# Patient Record
Sex: Female | Born: 1945
Health system: Southern US, Community
[De-identification: ages and names within clinical notes are randomized; demographics above are authoritative.]

## PROBLEM LIST (undated history)

## (undated) DIAGNOSIS — IMO0002 Reserved for concepts with insufficient information to code with codable children: Secondary | ICD-10-CM

## (undated) DIAGNOSIS — E785 Hyperlipidemia, unspecified: Secondary | ICD-10-CM

## (undated) HISTORY — PX: CERVICAL CONE BIOPSY: SUR198

## (undated) HISTORY — PX: JOINT REPLACEMENT: SHX530

## (undated) HISTORY — DX: Reserved for concepts with insufficient information to code with codable children: IMO0002

## (undated) HISTORY — DX: Hyperlipidemia, unspecified: E78.5

---

## 1993-11-24 DIAGNOSIS — IMO0002 Reserved for concepts with insufficient information to code with codable children: Secondary | ICD-10-CM

## 1993-11-24 HISTORY — DX: Reserved for concepts with insufficient information to code with codable children: IMO0002

## 2004-08-14 ENCOUNTER — Other Ambulatory Visit: Admission: RE | Admit: 2004-08-14 | Discharge: 2004-08-14 | Payer: Self-pay | Admitting: Obstetrics and Gynecology

## 2008-09-21 ENCOUNTER — Encounter: Admission: RE | Admit: 2008-09-21 | Discharge: 2008-09-21 | Payer: Self-pay | Admitting: Family Medicine

## 2008-09-28 ENCOUNTER — Encounter: Admission: RE | Admit: 2008-09-28 | Discharge: 2008-09-28 | Payer: Self-pay | Admitting: Family Medicine

## 2008-12-05 ENCOUNTER — Encounter: Admission: RE | Admit: 2008-12-05 | Discharge: 2008-12-05 | Payer: Self-pay | Admitting: Orthopaedic Surgery

## 2009-01-05 ENCOUNTER — Encounter: Admission: RE | Admit: 2009-01-05 | Discharge: 2009-04-05 | Payer: Self-pay | Admitting: Orthopaedic Surgery

## 2009-10-01 ENCOUNTER — Encounter: Admission: RE | Admit: 2009-10-01 | Discharge: 2009-10-01 | Payer: Self-pay | Admitting: Family Medicine

## 2010-10-28 ENCOUNTER — Encounter: Admission: RE | Admit: 2010-10-28 | Discharge: 2010-10-28 | Payer: Self-pay | Admitting: Family Medicine

## 2010-11-04 ENCOUNTER — Encounter
Admission: RE | Admit: 2010-11-04 | Discharge: 2010-11-04 | Payer: Self-pay | Source: Home / Self Care | Attending: Family Medicine | Admitting: Family Medicine

## 2011-10-25 LAB — HM MAMMOGRAPHY

## 2012-08-27 ENCOUNTER — Ambulatory Visit: Payer: Self-pay | Admitting: Family Medicine

## 2012-09-29 ENCOUNTER — Encounter: Payer: Self-pay | Admitting: Family Medicine

## 2012-09-29 ENCOUNTER — Ambulatory Visit (INDEPENDENT_AMBULATORY_CARE_PROVIDER_SITE_OTHER): Payer: 59 | Admitting: Family Medicine

## 2012-09-29 ENCOUNTER — Other Ambulatory Visit: Payer: Self-pay | Admitting: Family Medicine

## 2012-09-29 ENCOUNTER — Other Ambulatory Visit: Payer: Self-pay | Admitting: *Deleted

## 2012-09-29 VITALS — BP 120/80 | HR 82 | Temp 98.3°F | Ht 61.0 in | Wt 159.0 lb

## 2012-09-29 DIAGNOSIS — Z1231 Encounter for screening mammogram for malignant neoplasm of breast: Secondary | ICD-10-CM

## 2012-09-29 DIAGNOSIS — Z7689 Persons encountering health services in other specified circumstances: Secondary | ICD-10-CM

## 2012-09-29 DIAGNOSIS — E785 Hyperlipidemia, unspecified: Secondary | ICD-10-CM

## 2012-09-29 DIAGNOSIS — Z131 Encounter for screening for diabetes mellitus: Secondary | ICD-10-CM

## 2012-09-29 DIAGNOSIS — Z7189 Other specified counseling: Secondary | ICD-10-CM

## 2012-09-29 LAB — LIPID PANEL
Cholesterol: 257 mg/dL — ABNORMAL HIGH (ref 0–200)
Total CHOL/HDL Ratio: 4
Triglycerides: 58 mg/dL (ref 0.0–149.0)

## 2012-09-29 NOTE — Progress Notes (Signed)
Chief Complaint  Patient presents with  . Establish Care    HPI: Emelee Rodocker is here to establish care. Used to see Dr. Larina Bras but office closed. Father died of heart disease and smoked and sister died of lung cancer from smoking. Owns insurance company.   Has the following concerns today:  HLD: -chronically -good cholesterol is high and bad cholesterol is high -has been taking red yeast rice which helps some  -does not exercise on a regular basis  -diet is healthy and watches portion sizes   Other Providers:  Flu vaccine: refused, UTD on varicella and tetanus  ROS: See pertinent positives and negatives per HPI.  Past Medical History  Diagnosis Date  . Hyperlipidemia   . Squamous cell carcinoma     Family History  Problem Relation Age of Onset  . Kidney cancer Sister     RENAL CARCINOMA    History   Social History  . Marital Status: Married    Spouse Name: N/A    Number of Children: N/A  . Years of Education: N/A   Social History Main Topics  . Smoking status: Never Smoker   . Smokeless tobacco: None  . Alcohol Use: No  . Drug Use: None  . Sexually Active: None   Other Topics Concern  . None   Social History Narrative  . None    Current outpatient prescriptions:Fish Oil-Cholecalciferol (FISH OIL + D3) 1200-1000 MG-UNIT CAPS, Take by mouth., Disp: , Rfl: ;  loratadine (CLARITIN) 10 MG tablet, Take 10 mg by mouth daily., Disp: , Rfl: ;  Lutein 20 MG CAPS, Take by mouth., Disp: , Rfl: ;  Multiple Vitamins-Minerals (WOMENS ONE DAILY PO), Take by mouth., Disp: , Rfl:   EXAM:  Filed Vitals:   09/29/12 0827  BP: 120/80  Pulse: 82  Temp: 98.3 F (36.8 C)    Body mass index is 30.04 kg/(m^2).  GENERAL: vitals reviewed and listed above, alert, oriented, appears well hydrated and in no acute distress  HEENT: atraumatic, conjunttiva clear, no obvious abnormalities on inspection of external nose and ears  NECK: no obvious masses on inspection  MS:  moves all extremities without noticeable abnormality  PSYCH: pleasant and cooperative, no obvious depression or anxiety  ASSESSMENT AND PLAN:  Discussed the following assessment and plan:  1. Encounter to establish care with new doctor    2. Hyperlipidemia  Lipid Panel  3. Screening for diabetes mellitus  Hemoglobin A1c   -patient will follow up for CPE/Health Maintenance exam  -We reviewed the PMH, PSH, FH, SH, Meds and Allergies. -We provided refills for any medications we will prescribe as needed. -We addressed current concerns per orders and patient instructions. -We have asked for records for pertinent exams, studies, vaccines and notes from previous providers. -We have advised patient to follow up per instructions below. -Influenza vaccine refused  -Patient advised to return or notify a doctor immediately if symptoms worsen or persist or new concerns arise.  Patient Instructions  -We have ordered labs or studies at this visit. It can take up to 1-2 weeks for results and processing. We will contact you with instructions IF your results are abnormal. Normal results will be released to your [facility]. If you have not heard from [place] or can not find your results in [facility] in 2 weeks please contact our office.  -PLEASE SIGN UP FOR MYCHART TODAY   We recommend the following healthy lifestyle measures: - eat a healthy diet consisting of lots of vegetables,  fruits, beans, nuts, seeds, healthy meats such as white chicken and fish and whole grains.  - avoid fried foods, fast food, processed foods, sodas, red meet and other fattening foods.  - get a least 150 minutes of aerobic exercise per week.   Follow up in: 1 month for your complete physical exam      KIM, HANNAH R.

## 2012-09-29 NOTE — Patient Instructions (Addendum)
-  We have ordered labs or studies at this visit. It can take up to 1-2 weeks for results and processing. We will contact you with instructions IF your results are abnormal. Normal results will be released to your [facility]. If you have not heard from [place] or can not find your results in [facility] in 2 weeks please contact our office.  -PLEASE SIGN UP FOR MYCHART TODAY   We recommend the following healthy lifestyle measures: - eat a healthy diet consisting of lots of vegetables, fruits, beans, nuts, seeds, healthy meats such as white chicken and fish and whole grains.  - avoid fried foods, fast food, processed foods, sodas, red meet and other fattening foods.  - get a least 150 minutes of aerobic exercise per week.   Follow up in: 1 month for your complete physical exam

## 2012-09-30 ENCOUNTER — Telehealth: Payer: Self-pay | Admitting: Family Medicine

## 2012-09-30 NOTE — Telephone Encounter (Signed)
Please let patient know, since not yet signed up for mychart, wanted to contact regarding lab results. Recommend signing up for mychart to see these results in about 10 days.  -cholesterol is a quite high -diabetes screening lab is a little high (>5.6) indicating a risk for developing diabetes  The best treatment to hopefully reverse these findings and prevent adverse health outcomes is a healthy diet and regular exercise.  Please make sure Nichole Chandler schedules her CPE in 1-2 months and we can talk about this at her appointment.

## 2012-10-04 NOTE — Telephone Encounter (Signed)
Called and spoke with pt and pt is aware and states she will call back to schedule cpe.

## 2012-11-08 ENCOUNTER — Ambulatory Visit
Admission: RE | Admit: 2012-11-08 | Discharge: 2012-11-08 | Disposition: A | Discharge status: Home / Self Care | Payer: 59 | Source: Ambulatory Visit | Attending: *Deleted | Admitting: *Deleted

## 2012-11-08 DIAGNOSIS — Z1231 Encounter for screening mammogram for malignant neoplasm of breast: Secondary | ICD-10-CM

## 2015-04-24 DIAGNOSIS — H40053 Ocular hypertension, bilateral: Secondary | ICD-10-CM | POA: Diagnosis not present

## 2016-04-22 DIAGNOSIS — L723 Sebaceous cyst: Secondary | ICD-10-CM | POA: Diagnosis not present

## 2016-04-22 DIAGNOSIS — H1013 Acute atopic conjunctivitis, bilateral: Secondary | ICD-10-CM | POA: Diagnosis not present

## 2017-03-03 ENCOUNTER — Telehealth: Payer: Self-pay | Admitting: Family Medicine

## 2017-03-03 NOTE — Telephone Encounter (Signed)
Let her know I am so very sorry to hear about her husband and of course I will see her again.Please schedule 30 minute appt. Thank you.

## 2017-03-03 NOTE — Telephone Encounter (Signed)
Pt would like to see if you would accept her back reason that she has not been in was due to her husband going through so much health issues and passing away.  And pt state that she really enjoyed the 1st visit with Dr. Maudie Mercury and hate that she was not able to continue without neglecting her husband. So would really like for Dr. Maudie Mercury to assist her with her medical needs.

## 2017-03-04 NOTE — Telephone Encounter (Signed)
Pt is aware of the msg from Dr. Maudie Mercury and will call back later today when ready to schedule.

## 2017-03-04 NOTE — Telephone Encounter (Signed)
Pt scheduled  

## 2017-03-24 ENCOUNTER — Encounter: Payer: Self-pay | Admitting: Family Medicine

## 2017-03-24 ENCOUNTER — Ambulatory Visit (INDEPENDENT_AMBULATORY_CARE_PROVIDER_SITE_OTHER): Payer: Medicare Other | Admitting: Family Medicine

## 2017-03-24 VITALS — BP 120/62 | HR 98 | Temp 98.4°F | Ht 61.0 in | Wt 140.7 lb

## 2017-03-24 DIAGNOSIS — Z23 Encounter for immunization: Secondary | ICD-10-CM | POA: Diagnosis not present

## 2017-03-24 DIAGNOSIS — E785 Hyperlipidemia, unspecified: Secondary | ICD-10-CM | POA: Diagnosis not present

## 2017-03-24 DIAGNOSIS — Z7689 Persons encountering health services in other specified circumstances: Secondary | ICD-10-CM

## 2017-03-24 DIAGNOSIS — R739 Hyperglycemia, unspecified: Secondary | ICD-10-CM

## 2017-03-24 DIAGNOSIS — Z789 Other specified health status: Secondary | ICD-10-CM

## 2017-03-24 DIAGNOSIS — B351 Tinea unguium: Secondary | ICD-10-CM

## 2017-03-24 DIAGNOSIS — Z7289 Other problems related to lifestyle: Secondary | ICD-10-CM

## 2017-03-24 NOTE — Progress Notes (Signed)
Pre visit review using our clinic review tool, if applicable. No additional management support is needed unless otherwise documented below in the visit note. 

## 2017-03-24 NOTE — Patient Instructions (Signed)
BEFORE YOU LEAVE: -order cologard -follow up: 1) fasting lab appointment 2) annual wellness visit with susan in next few months 3) follow up with Dr. Maudie Mercury in 3-4 months (skin ck/breast exam if she wishes then)  We ordered the Cologuard test for colon cancer screening. Please complete this test promptly once the kit arrives. Please contact us if you have not received your kit in the next few weeks.  Please call to schedule your mammogram.  We have ordered labs or studies at this visit. It can take up to 1-2 weeks for results and processing. IF results require follow up or explanation, we will call you with instructions. Clinically stable results will be released to your [facility]. If you have not heard from [place] or cannot find your results in [facility] in 2 weeks please contact our office at [phone].  If you are not yet signed up for [facility], please consider signing up.   We recommend the following healthy lifestyle for LIFE: 1) Small portions.   Tip: eat off of a salad plate instead of a dinner plate.  Tip: if you need more or a snack choose fruits, veggies and/or a handful of nuts or seeds.  2) Eat a healthy clean diet.  * Tip: Avoid (less then 1 serving per week): processed foods, sweets, sweetened drinks, white starches (rice, flour, bread, potatoes, pasta, etc), red meat, fast foods, butter  *Tip: CHOOSE instead   * 5-9 servings per day of fresh or frozen fruits and vegetables (but not corn, potatoes, bananas, canned or dried fruit)   *nuts and seeds, beans   *olives and olive oil   *small portions of lean meats such as fish and white chicken    *small portions of whole grains  3)Get at least 150 minutes of sweaty aerobic exercise per week.  4)Reduce stress - consider counseling, meditation and relaxation to balance other aspects of your life.  WE NOW OFFER   [facility] FAST TRACK!!!  SAME DAY Appointments for ACUTE CARE  Such as: Sprains, Injuries, cuts,  abrasions, rashes, muscle pain, joint pain, back pain Colds, flu, sore throats, headache, allergies, cough, fever  Ear pain, sinus and eye infections Abdominal pain, nausea, vomiting, diarrhea, upset stomach Animal/insect bites  3 Easy Ways to Schedule: Walk-In Scheduling Call in scheduling Mychart Sign-up: [email or web address]

## 2017-03-24 NOTE — Progress Notes (Signed)
HPI:  Kinslie Hove is here to reestablish care. Seen once in 2013. Reports had a lot going on. 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 She now is trying to focus on herself and take care of herself. Hx of mild hyperlipidemia and hyperglycemia. Needs labs. Feels good. 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 No smoking. 1-3 beers per day. Only complaint is toenail fungus. She prefers to avoid medications for this if possible. Due for a number of preventive care measures. Agrees to set up mammogram. Opted for cologard after discussion of options. Declined vaccines. Reports all paps in last 30 years normal.  ROS negative for unless reported above: fevers, unintentional weight loss, hearing or vision loss, chest pain, palpitations, struggling to breath, hemoptysis, melena, hematochezia, hematuria, falls, loc, si, thoughts of self harm  Past Medical History:  Diagnosis Date  . Hyperlipidemia   . Squamous cell carcinoma     No past surgical history on file.  Family History  Problem Relation Age of Onset  . Kidney cancer Sister     RENAL CARCINOMA  . Lung cancer Sister     smoker  . Heart disease Father     Social History   Social History  . Marital status: Widowed    Spouse name: N/A  . Number of children: N/A  . Years of education: N/A   Social History Main Topics  . Smoking status: Never Smoker  . Smokeless tobacco: Never Used  . Alcohol use No  . Drug use: Unknown  . Sexual activity: Not Asked   Other Topics Concern  . None   Social History Narrative  . None     Current Outpatient Prescriptions:  .  Fish Oil-Cholecalciferol (FISH OIL + D3) 1200-1000 MG-UNIT CAPS, Take by mouth., Disp: , Rfl:  .  Lutein 20 MG CAPS, Take by mouth., Disp: , Rfl:  .  Multiple Vitamins-Minerals (WOMENS ONE DAILY PO), Take by mouth., Disp: , Rfl:  .  OVER THE COUNTER MEDICATION, Keratin (something for hair,etc.), Disp: , Rfl:    EXAM:  Vitals:   03/24/17 1515  BP: 120/62  Pulse: 98  Temp: 98.4 F (36.9 C)    Body mass index is 26.59 kg/m.  GENERAL: vitals reviewed and listed above, alert, oriented, appears well hydrated and in no acute distress  HEENT: atraumatic, conjunttiva clear, no obvious abnormalities on inspection of external nose and ears  NECK: no obvious masses on inspection  LUNGS: clear to auscultation bilaterally, no wheezes, rales or rhonchi, good air movement  CV: HRRR, no peripheral edema  SKIN: thickened friable great toenail L  MS: moves all extremities without noticeable abnormality  PSYCH: pleasant and cooperative, no obvious depression or anxiety  ASSESSMENT AND PLAN:  Discussed the following assessment and plan:  Encounter to establish care  Hyperlipidemia, unspecified hyperlipidemia type - Plan: Lipid panel  Hyperglycemia - Plan: Hemoglobin A1c  hep c screening - Plan: Hepatitis C antibody  Alcohol use - Plan: Comprehensive metabolic panel  Nail fungus  Need for tetanus booster - Plan: Td vaccine greater than or equal to 7yo preservative free IM  -We reviewed the PMH, PSH, FH, SH, Meds and Allergies. -We provided refills for any medications we will prescribe as needed. -We addressed current concerns per orders and patient instructions. -We have advised patient to follow up per instructions below. -she wants natural remedy for nail fungus and opted to try apple cider vinegar -Patient advised to return or notify  a doctor immediately if symptoms worsen or persist or new concerns arise.  Patient Instructions  BEFORE YOU LEAVE: -order cologard -follow up: 1) fasting lab appointment 2) annual wellness visit with susan in next few months 3) follow up with Dr. Maudie Mercury in 3-4 months (skin ck/breast exam if she wishes then)  We ordered the Cologuard test for colon cancer screening. Please complete this test promptly once the kit arrives. Please contact us if you have  not received your kit in the next few weeks.  Please call to schedule your mammogram.  We have ordered labs or studies at this visit. It can take up to 1-2 weeks for results and processing. IF results require follow up or explanation, we will call you with instructions. Clinically stable results will be released to your [facility]. If you have not heard from [place] or cannot find your results in [facility] in 2 weeks please contact our office at [phone].  If you are not yet signed up for [facility], please consider signing up.   We recommend the following healthy lifestyle for LIFE: 1) Small portions.   Tip: eat off of a salad plate instead of a dinner plate.  Tip: if you need more or a snack choose fruits, veggies and/or a handful of nuts or seeds.  2) Eat a healthy clean diet.  * Tip: Avoid (less then 1 serving per week): processed foods, sweets, sweetened drinks, white starches (rice, flour, bread, potatoes, pasta, etc), red meat, fast foods, butter  *Tip: CHOOSE instead   * 5-9 servings per day of fresh or frozen fruits and vegetables (but not corn, potatoes, bananas, canned or dried fruit)   *nuts and seeds, beans   *olives and olive oil   *small portions of lean meats such as fish and white chicken    *small portions of whole grains  3)Get at least 150 minutes of sweaty aerobic exercise per week.  4)Reduce stress - consider counseling, meditation and relaxation to balance other aspects of your life.  WE NOW OFFER   [facility] FAST TRACK!!!  SAME DAY Appointments for ACUTE CARE  Such as: Sprains, Injuries, cuts, abrasions, rashes, muscle pain, joint pain, back pain Colds, flu, sore throats, headache, allergies, cough, fever  Ear pain, sinus and eye infections Abdominal pain, nausea, vomiting, diarrhea, upset stomach Animal/insect bites  3 Easy Ways to Schedule: Walk-In Scheduling Call in scheduling Mychart Sign-up:  [email or web address]               Colin Benton R.

## 2017-03-31 ENCOUNTER — Other Ambulatory Visit (INDEPENDENT_AMBULATORY_CARE_PROVIDER_SITE_OTHER): Payer: Medicare Other

## 2017-03-31 DIAGNOSIS — Z7289 Other problems related to lifestyle: Secondary | ICD-10-CM | POA: Diagnosis not present

## 2017-03-31 DIAGNOSIS — Z789 Other specified health status: Secondary | ICD-10-CM | POA: Diagnosis not present

## 2017-03-31 DIAGNOSIS — R739 Hyperglycemia, unspecified: Secondary | ICD-10-CM | POA: Diagnosis not present

## 2017-03-31 DIAGNOSIS — E785 Hyperlipidemia, unspecified: Secondary | ICD-10-CM | POA: Diagnosis not present

## 2017-03-31 LAB — COMPREHENSIVE METABOLIC PANEL
ALK PHOS: 46 U/L (ref 39–117)
ALT: 17 U/L (ref 0–35)
AST: 23 U/L (ref 0–37)
Albumin: 4.4 g/dL (ref 3.5–5.2)
BILIRUBIN TOTAL: 0.6 mg/dL (ref 0.2–1.2)
BUN: 20 mg/dL (ref 6–23)
CALCIUM: 9.8 mg/dL (ref 8.4–10.5)
CO2: 28 mEq/L (ref 19–32)
Chloride: 105 mEq/L (ref 96–112)
Creatinine, Ser: 0.71 mg/dL (ref 0.40–1.20)
GFR: 86.81 mL/min (ref >60.00)
Glucose, Bld: 100 mg/dL — ABNORMAL HIGH (ref 70–99)
Potassium: 4.4 mEq/L (ref 3.5–5.1)
Sodium: 141 mEq/L (ref 135–145)
TOTAL PROTEIN: 7.1 g/dL (ref 6.0–8.3)

## 2017-03-31 LAB — LIPID PANEL
CHOLESTEROL: 262 mg/dL — AB (ref 0–200)
HDL: 77.2 mg/dL (ref >39.00)
LDL Cholesterol: 171 mg/dL — ABNORMAL HIGH (ref 0–99)
NonHDL: 184.49
TRIGLYCERIDES: 68 mg/dL (ref 0.0–149.0)
Total CHOL/HDL Ratio: 3
VLDL: 13.6 mg/dL (ref 0.0–40.0)

## 2017-03-31 LAB — HEMOGLOBIN A1C: HEMOGLOBIN A1C: 6.1 % (ref 4.6–6.5)

## 2017-04-01 ENCOUNTER — Other Ambulatory Visit: Payer: Self-pay | Admitting: Family Medicine

## 2017-04-01 DIAGNOSIS — E785 Hyperlipidemia, unspecified: Secondary | ICD-10-CM

## 2017-04-01 LAB — HEPATITIS C ANTIBODY: HCV AB: NEGATIVE

## 2017-04-21 DIAGNOSIS — H40013 Open angle with borderline findings, low risk, bilateral: Secondary | ICD-10-CM | POA: Diagnosis not present

## 2017-04-29 ENCOUNTER — Telehealth: Payer: Self-pay

## 2017-04-29 ENCOUNTER — Ambulatory Visit (INDEPENDENT_AMBULATORY_CARE_PROVIDER_SITE_OTHER): Payer: Medicare Other

## 2017-04-29 VITALS — BP 124/60 | HR 84 | Ht 61.0 in | Wt 140.5 lb

## 2017-04-29 DIAGNOSIS — Z0000 Encounter for general adult medical examination without abnormal findings: Secondary | ICD-10-CM

## 2017-04-29 NOTE — Patient Instructions (Addendum)
Nichole Chandler , Thank you for taking time to come for your Medicare Wellness Visit. I appreciate your ongoing commitment to your health goals. Please review the following plan we discussed and let me know if I can assist you in the future.  It is helpful to do memory games; puzzles; Lumosity or brain teasers of your choice by book; online or other.     Nichole Chandler will try to find your labs if sent from dr. Joaquim Lai  In the meantime, you can go to the [email or web address] and search for pre-diabetes Educated regarding prediabetes and numbers;  A1c ranges from 5.8 to 6.5 or fasting Blood sugar > 115 -126; (126 is diabetic)   Risk: >69yo; family hx; overweight or obese; African American; Hispanic; Latino; American [place] American; [place]; history of diabetes when pregnant; or birth to a baby weighing over 9 lbs. Being less physically active than 30 minutes; 3 times a week;   Prevention; Losing a modest 7 to 8 lbs; If over 200 lbs; 10 to 14 lbs;  Choose healthier foods; colorful veggies; fish or lean meats; drinks water Reduce portion size Start exercising; 30 minutes of fast walking x 30 minutes per day/ 60 min for weight loss   Nichole Chandler to order the cologuard, you should receive it within 10 days / to call cell number [phone] Therefore, Medicare Part B will cover the CologuardTM test once every three years for beneficiaries who meet all of the following criteria:  Age 69 to 87 years,  Asymptomatic (no signs or symptoms of colorectal disease including but not limited to lower gastrointestinal pain, blood in stool, positive guaiac fecal occult blood test or fecal immunochemical test), and  At average risk of developing colorectal cancer (no personal history of adenomatous polyps, colorectal cancer, or inflammatory bowel disease, including Crohn's Disease and ulcerative colitis; no family history of colorectal cancers or adenomatous polyps, familial adenomatous polyposis, or hereditary  nonpolyposis colorectal cancer).  The Centers for Disease Control are now recommending 2 pneumonia vaccinations after 69. The first is the Prevnar 13. This helps to boost your immunity to community acquired pneumonia as well as some protection from bacterial pneumonia  The 2nd is the pneumovax 23, which offers more broad protection!  Please consider taking these as this is your best protection against pneumonia. You can review the CDC.gov for more information    Shingrix is a vaccine for the prevention of Shingles in Adults 69 and older.  If you are on Medicare, you can request a prescription from your doctor to be filled at a pharmacy.  Please check with your benefits regarding applicable copays or out of pocket expenses.  The Shingrix is given in 2 vaccines approx 8 weeks apart. You must receive the 2nd dose prior to 6 months from receipt of the first.   Nichole Chandler will put an order in for the [facility]; mammogram Has had a couple of bone density and they were normal   Will discuss dexa with Dr. Maudie Mercury since you had this at 71 and it was normal     These are the goals we discussed: Goals    . patient          Maintain health; Watch chol  Cholesterol Cholesterol is a fat. Your body needs a small amount of cholesterol. Cholesterol (plaque) may build up in your blood vessels (arteries). That makes you more likely to have a heart attack or stroke. You cannot feel your cholesterol level. Having a blood  test is the only way to find out if your level is high. Keep your test results. Work with your doctor to keep your cholesterol at a good level. What do the results mean?  Total cholesterol is how much cholesterol is in your blood.  LDL is bad cholesterol. This is the type that can build up. Try to have low LDL.  HDL is good cholesterol. It cleans your blood vessels and carries LDL away. Try to have high HDL.  Triglycerides are fat that the body can store or burn for energy. What are  good levels of cholesterol?  Total cholesterol below 200.  LDL below 100 is good for people who have health risks. LDL below 70 is good for people who have very high risks.  HDL above 40 is good. It is best to have HDL of 60 or higher.  Triglycerides below 150. How can I lower my cholesterol? Diet Follow your diet program as told by your doctor.  Choose fish, white meat chicken, or [place] that is roasted or baked. Try not to eat red meat, fried foods, sausage, or lunch meats.  Eat lots of fresh fruits and vegetables.  Choose whole grains, beans, pasta, potatoes, and cereals.  Choose olive oil, corn oil, or canola oil. Only use small amounts.  Try not to eat butter, mayonnaise, shortening, or palm kernel oils.  Try not to eat foods with trans fats.  Choose low-fat or nonfat dairy foods. ? Drink skim or nonfat milk. ? Eat low-fat or nonfat yogurt and cheeses. ? Try not to drink whole milk or cream. ? Try not to eat ice cream, egg yolks, or full-fat cheeses.  Healthy desserts include angel food cake, ginger snaps, animal crackers, hard candy, popsicles, and low-fat or nonfat frozen yogurt. Try not to eat pastries, cakes, pies, and cookies.  Exercise Follow your exercise program as told by your doctor.  Be more active. Try gardening, walking, and taking the stairs.  Ask your doctor about ways that you can be more active.  Medicine  Take over-the-counter and prescription medicines only as told by your doctor.  This information is not intended to replace advice given to you by your health care provider. Make sure you discuss any questions you have with your health care provider. Document Released: 02/06/2009 Document Revised: 06/11/2016 Document Reviewed: 05/22/2016 Elsevier Interactive Patient Education  2017 [employer].        This is a list of the screening recommended for you and due dates:  Health Maintenance  Topic Date Due  . Colon Cancer Screening   06/18/1998  . Pneumonia vaccines (1 of 2 - PCV13) 06/18/2013  . Mammogram  11/08/2014  . Flu Shot  06/24/2017  . Tetanus Vaccine  03/25/2027  . DEXA scan (bone density measurement)  Completed  .  Hepatitis C: One time screening is recommended by Center for Disease Control  (CDC) for  adults born from [birth year] through 1965.   Completed    Prevention of falls: Remove rugs or any tripping hazards in the home Use Non slip mats in bathtubs and showers Placing grab bars next to the toilet and or shower Placing handrails on both sides of the stair way Adding extra lighting in the home.   Personal safety issues reviewed:  1. Consider starting a community watch program per [facility] 2.  Changes batteries is smoke detector and/or carbon monoxide detector  3.  If you have firearms; keep them in a safe place 4.  Wear protection when in the sun; Always wear sunscreen or a hat; It is good to have your doctor check your skin annually or review any new areas of concern 5. Driving safety; Keep in the right lane; stay 3 car lengths behind the car in front of you on the highway; look 3 times prior to pulling out; carry your cell phone everywhere you go!    Learn about the Yellow Dot program:  The program allows first responders at your emergency to have access to who your physician is, as well as your medications and medical conditions.  Citizens requesting the Yellow Dot Packages should contact Master Corporal Nunzio Cobbs at the [employer] [phone] for the first week of the program and beginning the week after Easter citizens should contact their Scientist, physiological.  Meal Ideas & Options:   Breakfast:  ? Whole wheat toast or whole wheat English muffins with peanut butter & hard boiled egg ? Steel cut oats topped with apples & cinnamon and skim milk  ? Fresh fruit: banana, strawberries, melon, berries, peaches  ? Smoothies: strawberries, bananas, greek  yogurt, peanut butter ? Low fat greek yogurt with blueberries and granola  ? Egg white omelet with spinach and mushrooms ? Breakfast couscous: whole wheat couscous, apricots, skim milk, cranberries   Sandwiches:  ? Hummus and grilled vegetables (peppers, zucchini, squash) on whole wheat bread   ? Grilled chicken on whole wheat pita with lettuce, tomatoes, cucumbers or tzatziki  ? Tuna salad on whole wheat bread: tuna salad made with greek yogurt, olives, red peppers, capers, green onions ? Garlic rosemary lamb pita: lamb sauted with garlic, rosemary, salt & pepper; add lettuce, cucumber, greek yogurt to pita - flavor with lemon juice and black pepper   Seafood:  ? Mediterranean grilled salmon, seasoned with garlic, basil, parsley, lemon juice and black pepper ? Shrimp, lemon, and spinach whole-grain pasta salad made with low fat greek yogurt  ? Seared scallops with lemon orzo  ? Seared tuna steaks seasoned salt, pepper, coriander topped with tomato mixture of olives, tomatoes, olive oil, minced garlic, parsley, green onions and cappers   Meats:  ? Herbed greek chicken salad with kalamata olives, cucumber, feta  ? Red bell peppers stuffed with spinach, bulgur, lean ground beef (or lentils) & topped with feta   ? Kebabs: skewers of chicken, tomatoes, onions, zucchini, squash  ? [place] burgers: made with red onions, mint, dill, lemon juice, feta cheese topped with roasted red peppers  Vegetarian ? Cucumber salad: cucumbers, artichoke hearts, celery, red onion, feta cheese, tossed in olive oil & lemon juice  ? Hummus and whole grain pita points with a greek salad (lettuce, tomato, feta, olives, cucumbers, red onion) ? Lentil soup with celery, carrots made with vegetable broth, garlic, salt and pepper  ? Tabouli salad: parsley, bulgur, mint, scallions, cucumbers, tomato, radishes, lemon juice, olive oil, salt and pepper.  Health Maintenance, Female Adopting a healthy lifestyle and getting  preventive care can go a long way to promote health and wellness. Talk with your health care provider about what schedule of regular examinations is right for you. This is a good chance for you to check in with your provider about disease prevention and staying healthy. In between checkups, there are plenty of things you can do on your own. Experts have done a lot of research about which lifestyle changes and preventive measures are most likely to keep you healthy. Ask your health care provider for  more information. Weight and diet Eat a healthy diet  Be sure to include plenty of vegetables, fruits, low-fat dairy products, and lean protein.  Do not eat a lot of foods high in solid fats, added sugars, or salt.  Get regular exercise. This is one of the most important things you can do for your health. ? Most adults should exercise for at least 150 minutes each week. The exercise should increase your heart rate and make you sweat (moderate-intensity exercise). ? Most adults should also do strengthening exercises at least twice a week. This is in addition to the moderate-intensity exercise.  Maintain a healthy weight  Body mass index (BMI) is a measurement that can be used to identify possible weight problems. It estimates body fat based on height and weight. Your health care provider can help determine your BMI and help you achieve or maintain a healthy weight.  For females 76 years of age and older: ? A BMI below 18.5 is considered underweight. ? A BMI of 18.5 to 24.9 is normal. ? A BMI of 25 to 29.9 is considered overweight. ? A BMI of 30 and above is considered obese.  Watch levels of cholesterol and blood lipids  You should start having your blood tested for lipids and cholesterol at 69 years of age, then have this test every 5 years.  You may need to have your cholesterol levels checked more often if: ? Your lipid or cholesterol levels are high. ? You are older than 69 years of  age. ? You are at high risk for heart disease.  Cancer screening Lung Cancer  Lung cancer screening is recommended for adults 65-75 years old who are at high risk for lung cancer because of a history of smoking.  A yearly low-dose CT scan of the lungs is recommended for people who: ? Currently smoke. ? Have quit within the past 15 years. ? Have at least a 30-pack-year history of smoking. A pack year is smoking an average of one pack of cigarettes a day for 1 year.  Yearly screening should continue until it has been 15 years since you quit.  Yearly screening should stop if you develop a health problem that would prevent you from having lung cancer treatment.  Breast Cancer  Practice breast self-awareness. This means understanding how your breasts normally appear and feel.  It also means doing regular breast self-exams. Let your health care provider know about any changes, no matter how small.  If you are in your 20s or 30s, you should have a clinical breast exam (CBE) by a health care provider every 1-3 years as part of a regular health exam.  If you are 62 or older, have a CBE every year. Also consider having a breast X-ray (mammogram) every year.  If you have a family history of breast cancer, talk to your health care provider about genetic screening.  If you are at high risk for breast cancer, talk to your health care provider about having an MRI and a mammogram every year.  Breast cancer gene (BRCA) assessment is recommended for women who have family members with BRCA-related cancers. BRCA-related cancers include: ? Breast. ? Ovarian. ? Tubal. ? Peritoneal cancers.  Results of the assessment will determine the need for genetic counseling and BRCA1 and BRCA2 testing.  Cervical Cancer Your health care provider may recommend that you be screened regularly for cancer of the pelvic organs (ovaries, uterus, and vagina). This screening involves a pelvic examination, including  checking  for microscopic changes to the surface of your cervix (Pap test). You may be encouraged to have this screening done every 3 years, beginning at age 49.  For women ages 70-65, health care providers may recommend pelvic exams and Pap testing every 3 years, or they may recommend the Pap and pelvic exam, combined with testing for human papilloma virus (HPV), every 5 years. Some types of HPV increase your risk of cervical cancer. Testing for HPV may also be done on women of any age with unclear Pap test results.  Other health care providers may not recommend any screening for nonpregnant women who are considered low risk for pelvic cancer and who do not have symptoms. Ask your health care provider if a screening pelvic exam is right for you.  If you have had past treatment for cervical cancer or a condition that could lead to cancer, you need Pap tests and screening for cancer for at least 20 years after your treatment. If Pap tests have been discontinued, your risk factors (such as having a new sexual partner) need to be reassessed to determine if screening should resume. Some women have medical problems that increase the chance of getting cervical cancer. In these cases, your health care provider may recommend more frequent screening and Pap tests.  Colorectal Cancer  This type of cancer can be detected and often prevented.  Routine colorectal cancer screening usually begins at 69 years of age and continues through 69 years of age.  Your health care provider may recommend screening at an earlier age if you have risk factors for colon cancer.  Your health care provider may also recommend using home test kits to check for hidden blood in the stool.  A small camera at the end of a tube can be used to examine your colon directly (sigmoidoscopy or colonoscopy). This is done to check for the earliest forms of colorectal cancer.  Routine screening usually begins at age 65.  Direct examination of  the colon should be repeated every 5-10 years through 69 years of age. However, you may need to be screened more often if early forms of precancerous polyps or small growths are found.  Skin Cancer  Check your skin from head to toe regularly.  Tell your health care provider about any new moles or changes in moles, especially if there is a change in a mole's shape or color.  Also tell your health care provider if you have a mole that is larger than the size of a pencil eraser.  Always use sunscreen. Apply sunscreen liberally and repeatedly throughout the day.  Protect yourself by wearing long sleeves, pants, a wide-brimmed hat, and sunglasses whenever you are outside.  Heart disease, diabetes, and high blood pressure  High blood pressure causes heart disease and increases the risk of stroke. High blood pressure is more likely to develop in: ? People who have blood pressure in the high end of the normal range (130-139/85-89 mm Hg). ? People who are overweight or obese. ? People who are African American.  If you are 18-49 years of age, have your blood pressure checked every 3-5 years. If you are 35 years of age or older, have your blood pressure checked every year. You should have your blood pressure measured twice-once when you are at a hospital or clinic, and once when you are not at a hospital or clinic. Record the average of the two measurements. To check your blood pressure when you are not at a hospital  or clinic, you can use: ? An automated blood pressure machine at a pharmacy. ? A home blood pressure monitor.  If you are between 73 years and 31 years old, ask your health care provider if you should take aspirin to prevent strokes.  Have regular diabetes screenings. This involves taking a blood sample to check your fasting blood sugar level. ? If you are at a normal weight and have a low risk for diabetes, have this test once every three years after 69 years of age. ? If you are  overweight and have a high risk for diabetes, consider being tested at a younger age or more often. Preventing infection Hepatitis B  If you have a higher risk for hepatitis B, you should be screened for this virus. You are considered at high risk for hepatitis B if: ? You were born in a country where hepatitis B is common. Ask your health care provider which countries are considered high risk. ? Your parents were born in a high-risk country, and you have not been immunized against hepatitis B (hepatitis B vaccine). ? You have HIV or AIDS. ? You use needles to inject street drugs. ? You live with someone who has hepatitis B. ? You have had sex with someone who has hepatitis B. ? You get hemodialysis treatment. ? You take certain medicines for conditions, including cancer, organ transplantation, and autoimmune conditions.  Hepatitis C  Blood testing is recommended for: ? Everyone born from [birth year] through 1963. ? Anyone with known risk factors for hepatitis C.  Sexually transmitted infections (STIs)  You should be screened for sexually transmitted infections (STIs) including gonorrhea and chlamydia if: ? You are sexually active and are younger than 69 years of age. ? You are older than 69 years of age and your health care provider tells you that you are at risk for this type of infection. ? Your sexual activity has changed since you were last screened and you are at an increased risk for chlamydia or gonorrhea. Ask your health care provider if you are at risk.  If you do not have HIV, but are at risk, it may be recommended that you take a prescription medicine daily to prevent HIV infection. This is called pre-exposure prophylaxis (PrEP). You are considered at risk if: ? You are sexually active and do not regularly use condoms or know the HIV status of your partner(s). ? You take drugs by injection. ? You are sexually active with a partner who has HIV.  Talk with your health care provider  about whether you are at high risk of being infected with HIV. If you choose to begin PrEP, you should first be tested for HIV. You should then be tested every 3 months for as long as you are taking PrEP. Pregnancy  If you are premenopausal and you may become pregnant, ask your health care provider about preconception counseling.  If you may become pregnant, take 400 to 800 micrograms (mcg) of folic acid every day.  If you want to prevent pregnancy, talk to your health care provider about birth control (contraception). Osteoporosis and menopause  Osteoporosis is a disease in which the bones lose minerals and strength with aging. This can result in serious bone fractures. Your risk for osteoporosis can be identified using a bone density scan.  If you are 68 years of age or older, or if you are at risk for osteoporosis and fractures, ask your health care provider if you should be screened.  Ask your health care provider whether you should take a calcium or vitamin D supplement to lower your risk for osteoporosis.  Menopause may have certain physical symptoms and risks.  Hormone replacement therapy may reduce some of these symptoms and risks. Talk to your health care provider about whether hormone replacement therapy is right for you. Follow these instructions at home:  Schedule regular health, dental, and eye exams.  Stay current with your immunizations.  Do not use any tobacco products including cigarettes, chewing tobacco, or electronic cigarettes.  If you are pregnant, do not drink alcohol.  If you are breastfeeding, limit how much and how often you drink alcohol.  Limit alcohol intake to no more than 1 drink per day for nonpregnant women. One drink equals 12 ounces of beer, 5 ounces of wine, or 1 ounces of hard liquor.  Do not use street drugs.  Do not share needles.  Ask your health care provider for help if you need support or information about quitting drugs.  Tell your  health care provider if you often feel depressed.  Tell your health care provider if you have ever been abused or do not feel safe at home. This information is not intended to replace advice given to you by your health care provider. Make sure you discuss any questions you have with your health care provider. Document Released: 05/26/2011 Document Revised: 04/17/2016 Document Reviewed: 08/14/2015 Elsevier Interactive Patient Education  2018 [place] in the Home Falls can cause injuries and can affect people from all age groups. There are many simple things that you can do to make your home safe and to help prevent falls. What can I do on the outside of my home?  Regularly repair the edges of walkways and driveways and fix any cracks.  Remove high doorway thresholds.  Trim any shrubbery on the main path into your home.  Use bright outdoor lighting.  Clear walkways of debris and clutter, including tools and rocks.  Regularly check that handrails are securely fastened and in good repair. Both sides of any steps should have handrails.  Install guardrails along the edges of any raised decks or porches.  Have leaves, snow, and ice cleared regularly.  Use sand or salt on walkways during winter months.  In the garage, clean up any spills right away, including grease or oil spills. What can I do in the bathroom?  Use night lights.  Install grab bars by the toilet and in the tub and shower. Do not use towel bars as grab bars.  Use non-skid mats or decals on the floor of the tub or shower.  If you need to sit down while you are in the shower, use a plastic, non-slip stool.  Keep the floor dry. Immediately clean up any water that spills on the floor.  Remove soap buildup in the tub or shower on a regular basis.  Attach bath mats securely with double-sided non-slip rug tape.  Remove throw rugs and other tripping hazards from the floor. What can I do in the  bedroom?  Use night lights.  Make sure that a bedside light is easy to reach.  Do not use oversized bedding that drapes onto the floor.  Have a firm chair that has side arms to use for getting dressed.  Remove throw rugs and other tripping hazards from the floor. What can I do in the kitchen?  Clean up any spills right away.  Avoid walking on wet floors.  Place frequently used items in easy-to-reach places.  If you need to reach for something above you, use a sturdy step stool that has a grab bar.  Keep electrical cables out of the way.  Do not use floor polish or wax that makes floors slippery. If you have to use wax, make sure that it is non-skid floor wax.  Remove throw rugs and other tripping hazards from the floor. What can I do in the stairways?  Do not leave any items on the stairs.  Make sure that there are handrails on both sides of the stairs. Fix handrails that are broken or loose. Make sure that handrails are as long as the stairways.  Check any carpeting to make sure that it is firmly attached to the stairs. Fix any carpet that is loose or worn.  Avoid having throw rugs at the top or bottom of stairways, or secure the rugs with carpet tape to prevent them from moving.  Make sure that you have a light switch at the top of the stairs and the bottom of the stairs. If you do not have them, have them installed. What are some other fall prevention tips?  Wear closed-toe shoes that fit well and support your feet. Wear shoes that have rubber soles or low heels.  When you use a stepladder, make sure that it is completely opened and that the sides are firmly locked. Have someone hold the ladder while you are using it. Do not climb a closed stepladder.  Add color or contrast paint or tape to grab bars and handrails in your home. Place contrasting color strips on the first and last steps.  Use mobility aids as needed, such as canes, walkers, scooters, and  crutches.  Turn on lights if it is dark. Replace any light bulbs that burn out.  Set up furniture so that there are clear paths. Keep the furniture in the same spot.  Fix any uneven floor surfaces.  Choose a carpet design that does not hide the edge of steps of a stairway.  Be aware of any and all pets.  Review your medicines with your healthcare provider. Some medicines can cause dizziness or changes in blood pressure, which increase your risk of falling. Talk with your health care provider about other ways that you can decrease your risk of falls. This may include working with a physical therapist or trainer to improve your strength, balance, and endurance. This information is not intended to replace advice given to you by your health care provider. Make sure you discuss any questions you have with your health care provider. Document Released: 10/31/2002 Document Revised: 04/08/2016 Document Reviewed: 12/15/2014 Elsevier Interactive Patient Education  2017 [employer].

## 2017-04-29 NOTE — Progress Notes (Signed)
Subjective:   Nichole Chandler is a 71 y.o. female who presents for an Initial Medicare Annual Wellness Visit.  The Patient was informed that the wellness visit is to identify future health risk and educate and initiate measures that can reduce risk for increased disease through the lifespan.    NO ROS; Medicare Wellness Visit Just retired officially in Dec Was a Psychologist, sport and exercise;  Secondary school teacher; started acquisition plan  Difficult to fup on preventive health due to sale of business and death of family member but will catch up now  Psychosocial Just lost her spouse; leukemia  Did come back to see Dr. Maudie Mercury  Lost spouse and then lost her mother 6 months later  Father died at 34 of MI and he ate a terrible diet    Describes health as good, fair or great?  great  Preventive Screening -Counseling & Management  Colonoscopy; Agrees to cologuard and will order  Pneumonia vaccines - declines for now Did take the tetanus p talking with DR. Doristine Bosworth;  [facility]  dexa in 2011; age 53; perfectly normal  Will discuss with Dr. Maudie Mercury prior to reordering  Smoking history - never smoked  Second Hand Smoke status; No Smokers in the home ETOH no   Medication adherence or issues? No   RISK FACTORS Diet Is monitoring chol;  Stopped eating sausage  Breakfast; toast and avocado; Occasional biscuit Lunch; very light; protein drink Supper; eat out some  Salad   Regular exercise  Yard work, [employer]; gardening  Takes care of her yard   Cardiac Risk Factors:  Advanced aged  >65 in women Hyperlipidemia- started eating sausage everyday Chol 262; Trigl 68; HDL 77; LDL 171  Ratio is 3;   Has started cleaning up diet Given education on lowering chol   Diabetes -  Family History  HD: Kidney disease Obesity  neg  Fall risk no Given education on "Fall Prevention in the Home" for more safety tips the patient can apply as appropriate.     Mobility of Functional  changes this year?no Safety; community, wears sunscreen, safe place for firearms; Motor vehicle accidents; reviewed and no issues   Mental Health:  Any emotional problems? Anxious, depressed, irritable, sad or blue? no Denies feeling depressed or hopeless; voices pleasure in daily life How many social activities have you been engaged in within the last 2 weeks? no .   Activities of Daily Living - See functional screen   Cognitive testing; Ad8 score; 0 or less than 2  MMSE deferred or completed if AD8 + 2 issues     Patient Care Team: Lucretia Kern, DO as PCP - General (Family Medicine) Dr. Anderson Malta stone was her doctor; [facility] was her doctor for many years but she relocated    Immunization History  Administered Date(s) Administered  . Td 03/24/2017   Required Immunizations needed today  Screening test up to date or reviewed for plan of completion Health Maintenance Due  Topic Date Due  . COLONOSCOPY  06/18/1998  . PNA vac Low Risk Adult (1 of 2 - PCV13) 06/18/2013  . MAMMOGRAM  11/08/2014    Agrees to cologuard and will order Declines pneumonia series Agrees to mammogram and repeat dexa if Dr. Maudie Mercury agrees        Objective:    Today's Vitals   04/29/17 0908  BP: 124/60  Pulse: 84  SpO2: 96%  Weight: 140 lb 8 oz (63.7 kg)  Height: 5'  1" (1.549 m)   Body mass index is 26.55 kg/m.   Current Medications (verified) Outpatient Encounter Prescriptions as of 04/29/2017  Medication Sig  . B Complex-C (B-COMPLEX WITH VITAMIN C) tablet Take 1 tablet by mouth daily.  Marland Kitchen BIOGAIA PROBIOTIC (BIOGAIA PROBIOTIC) LIQD Take by mouth daily at 8 pm.  . Biotin 10000 MCG TABS Take by mouth.  . docusate sodium (COLACE) 100 MG capsule Take 100 mg by mouth 2 (two) times daily.  . Fish Oil-Cholecalciferol (FISH OIL + D3) 1200-1000 MG-UNIT CAPS Take by mouth.  . loratadine (CLARITIN) 10 MG tablet Take 10 mg by mouth daily.  . Omega-3 Fatty Acids (FISH OIL) 1200 MG  CAPS Take by mouth.  . Red Yeast Rice 600 MG CAPS Take by mouth 2 (two) times daily.  . Lutein 20 MG CAPS Take by mouth.  . Multiple Vitamins-Minerals (WOMENS ONE DAILY PO) Take by mouth.  Marland Kitchen OVER THE COUNTER MEDICATION Keratin (something for hair,etc.)   No facility-administered encounter medications on file as of 04/29/2017.     Allergies (verified) Patient has no allergy information on record.   History: Past Medical History:  Diagnosis Date  . Hyperlipidemia   . Squamous cell carcinoma    No past surgical history on file. Family History  Problem Relation Age of Onset  . Kidney cancer Sister        RENAL CARCINOMA  . Lung cancer Sister        smoker  . Heart disease Father    Social History   Occupational History  . Not on file.   Social History Main Topics  . Smoking status: Never Smoker  . Smokeless tobacco: Never Used  . Alcohol use No  . Drug use: Unknown  . Sexual activity: Not on file    Tobacco Counseling Counseling given: Yes   Activities of Daily Living In your present state of health, do you have any difficulty performing the following activities: 04/29/2017  Hearing? N  Vision? N  Difficulty concentrating or making decisions? N  Walking or climbing stairs? N  Dressing or bathing? N  Doing errands, shopping? N  Preparing Food and eating ? N  Using the Toilet? N  In the past six months, have you accidently leaked urine? N  Do you have problems with loss of bowel control? N  Managing your Medications? N  Managing your Finances? N  Housekeeping or managing your Housekeeping? N  Some recent data might be hidden    Immunizations and Health Maintenance Immunization History  Administered Date(s) Administered  . Td 03/24/2017   Health Maintenance Due  Topic Date Due  . COLONOSCOPY  06/18/1998  . PNA vac Low Risk Adult (1 of 2 - PCV13) 06/18/2013  . MAMMOGRAM  11/08/2014    Patient Care Team: Lucretia Kern, DO as PCP - General (Family  Medicine)  Indicate any recent Medical Services you may have received from other than [facility] providers in the past year (date may be approximate).     Assessment:   This is a routine wellness examination for [name].   Hearing/Vision screen Hearing Screening Comments: No issue Vision Screening Comments: No issues with vision Regular eye exam  Dietary issues and exercise activities discussed: Current Exercise Habits: Structured exercise class (plans to start)  Goals    . patient          Maintain health; Watch chol  Cholesterol Cholesterol is a fat. Your body needs a small amount of cholesterol. Cholesterol (plaque) may  build up in your blood vessels (arteries). That makes you more likely to have a heart attack or stroke. You cannot feel your cholesterol level. Having a blood test is the only way to find out if your level is high. Keep your test results. Work with your doctor to keep your cholesterol at a good level. What do the results mean?  Total cholesterol is how much cholesterol is in your blood.  LDL is bad cholesterol. This is the type that can build up. Try to have low LDL.  HDL is good cholesterol. It cleans your blood vessels and carries LDL away. Try to have high HDL.  Triglycerides are fat that the body can store or burn for energy. What are good levels of cholesterol?  Total cholesterol below 200.  LDL below 100 is good for people who have health risks. LDL below 70 is good for people who have very high risks.  HDL above 40 is good. It is best to have HDL of 60 or higher.  Triglycerides below 150. How can I lower my cholesterol? Diet Follow your diet program as told by your doctor.  Choose fish, white meat chicken, or [place] that is roasted or baked. Try not to eat red meat, fried foods, sausage, or lunch meats.  Eat lots of fresh fruits and vegetables.  Choose whole grains, beans, pasta, potatoes, and cereals.  Choose olive oil, corn oil, or canola oil.  Only use small amounts.  Try not to eat butter, mayonnaise, shortening, or palm kernel oils.  Try not to eat foods with trans fats.  Choose low-fat or nonfat dairy foods. ? Drink skim or nonfat milk. ? Eat low-fat or nonfat yogurt and cheeses. ? Try not to drink whole milk or cream. ? Try not to eat ice cream, egg yolks, or full-fat cheeses.  Healthy desserts include angel food cake, ginger snaps, animal crackers, hard candy, popsicles, and low-fat or nonfat frozen yogurt. Try not to eat pastries, cakes, pies, and cookies.  Exercise Follow your exercise program as told by your doctor.  Be more active. Try gardening, walking, and taking the stairs.  Ask your doctor about ways that you can be more active.  Medicine  Take over-the-counter and prescription medicines only as told by your doctor.  This information is not intended to replace advice given to you by your health care provider. Make sure you discuss any questions you have with your health care provider. Document Released: 02/06/2009 Document Revised: 06/11/2016 Document Reviewed: 05/22/2016 Elsevier Interactive Patient Education  2017 [place] 2/9 Scores 04/29/2017  PHQ - 2 Score 0    Fall Risk Fall Risk  04/29/2017  Falls in the past year? No    Cognitive Function: MMSE - Mini Mental State Exam 04/29/2017  Not completed: (No Data)      Ad8 score is 0   Screening Tests Health Maintenance  Topic Date Due  . COLONOSCOPY  06/18/1998  . PNA vac Low Risk Adult (1 of 2 - PCV13) 06/18/2013  . MAMMOGRAM  11/08/2014  . INFLUENZA VACCINE  06/24/2017  . TETANUS/TDAP  03/25/2027  . DEXA SCAN  Completed  . Hepatitis C Screening  Completed      Plan:    PCP Notes  Health Maintenance Will order mammogram a the [facility] Will repeat dexa if Dr. Maudie Mercury approves; was normal but prior to age 37 The patient agrees  Declines pneumonia series   Will consider  the shingrix and  educated    Abnormal Screens  A1c elevated; educated regarding pre-diabetes and improving numbers, see Goals  Referrals none  Patient concerns; no, very happy and enjoying her retirement  Nurse Concerns; as noted  Next PCP apt TBS     I have personally reviewed and noted the following in the patient's chart:   . Medical and social history . Use of alcohol, tobacco or illicit drugs  . Current medications and supplements . Functional ability and status . Nutritional status . Physical activity . Advanced directives . List of other physicians . Hospitalizations, surgeries, and ER visits in previous 12 months . Vitals . Screenings to include cognitive, depression, and falls . Referrals and appointments  In addition, I have reviewed and discussed with patient certain preventive protocols, quality metrics, and best practice recommendations. A written personalized care plan for preventive services as well as general preventive health recommendations were provided to patient.     Wynetta Fines, RN   04/29/2017

## 2017-04-29 NOTE — Progress Notes (Signed)
I have reviewed and agree with note, evaluation, plan. Signed today for Dr. Maudie Mercury as she is out of office.   Garret Reddish, MD

## 2017-04-29 NOTE — Telephone Encounter (Signed)
AWV on 06/06  Had dexa  In 2011 when 64 Discussed and agreed to repeat due to guidelines and screening at 67 even though prior dexa was normal  Please advise if you agree and I will order dexa and mammogram at the breast center

## 2017-04-30 NOTE — Telephone Encounter (Signed)
I agree. Thanks.

## 2017-05-01 ENCOUNTER — Telehealth: Payer: Self-pay

## 2017-05-01 ENCOUNTER — Other Ambulatory Visit: Payer: Self-pay

## 2017-05-01 DIAGNOSIS — Z1231 Encounter for screening mammogram for malignant neoplasm of breast: Secondary | ICD-10-CM

## 2017-05-01 DIAGNOSIS — E2839 Other primary ovarian failure: Secondary | ICD-10-CM

## 2017-05-01 NOTE — Telephone Encounter (Signed)
Dr. Maudie Mercury agreed with repeat dexa and mammogram Order to the Breast center   Call to the patient to let her know the dexa and mammogram were ordered at the breast center

## 2017-06-10 ENCOUNTER — Other Ambulatory Visit: Payer: Self-pay | Admitting: Family Medicine

## 2017-06-10 ENCOUNTER — Telehealth: Payer: Self-pay | Admitting: Family Medicine

## 2017-06-10 DIAGNOSIS — Z1231 Encounter for screening mammogram for malignant neoplasm of breast: Secondary | ICD-10-CM

## 2017-06-10 NOTE — Telephone Encounter (Signed)
° ° °  Pt said she has not received the Colon Guard that was suppose to sent to her

## 2017-06-11 NOTE — Telephone Encounter (Signed)
I called the pt and informed her I re-faxed the order to [facility] and to call the office back if she has not heard anything in a week or so.

## 2017-06-23 ENCOUNTER — Ambulatory Visit
Admission: RE | Admit: 2017-06-23 | Discharge: 2017-06-23 | Disposition: A | Discharge status: Home / Self Care | Payer: Medicare Other | Source: Ambulatory Visit | Attending: Family Medicine | Admitting: Family Medicine

## 2017-06-23 DIAGNOSIS — Z1231 Encounter for screening mammogram for malignant neoplasm of breast: Secondary | ICD-10-CM

## 2017-06-23 DIAGNOSIS — M85832 Other specified disorders of bone density and structure, left forearm: Secondary | ICD-10-CM | POA: Diagnosis not present

## 2017-06-23 DIAGNOSIS — E2839 Other primary ovarian failure: Secondary | ICD-10-CM

## 2017-06-23 DIAGNOSIS — Z780 Asymptomatic menopausal state: Secondary | ICD-10-CM | POA: Diagnosis not present

## 2017-07-06 DIAGNOSIS — Z1212 Encounter for screening for malignant neoplasm of rectum: Secondary | ICD-10-CM | POA: Diagnosis not present

## 2017-07-06 DIAGNOSIS — Z1211 Encounter for screening for malignant neoplasm of colon: Secondary | ICD-10-CM | POA: Diagnosis not present

## 2017-07-06 LAB — COLOGUARD: Cologuard: NEGATIVE

## 2017-07-14 ENCOUNTER — Encounter: Payer: Self-pay | Admitting: Family Medicine

## 2017-08-13 ENCOUNTER — Encounter: Payer: Self-pay | Admitting: Family Medicine

## 2017-08-19 NOTE — Progress Notes (Signed)
HPI:  Due for flu shot, pcv13, lipid recheck  Follow up Hyperlipidemia/hyperglycemia/osteopenia: -LDL high, ratio ok -advised lifestyle changes and option of statin -reports she has made dramatic lifestyle changes including a much healthier diet, regular exercise, ensuring she is getting regular vitamin D -Feels great and lost 4 pounds -no chest pain, pain, shortness of breath -refuses vaccines  ROS: See pertinent positives and negatives per HPI.  Past Medical History:  Diagnosis Date  . Hyperlipidemia   . Squamous cell carcinoma     No past surgical history on file.  Family History  Problem Relation Age of Onset  . Kidney cancer Sister        RENAL CARCINOMA  . Lung cancer Sister        smoker  . Heart disease Father     Social History   Social History  . Marital status: Widowed    Spouse name: N/A  . Number of children: N/A  . Years of education: N/A   Social History Main Topics  . Smoking status: Never Smoker  . Smokeless tobacco: Never Used  . Alcohol use No  . Drug use: Unknown  . Sexual activity: Not Asked   Other Topics Concern  . None   Social History Narrative  . None     Current Outpatient Prescriptions:  .  Coenzyme Q10 (COQ10 PO), Take 200 mg by mouth daily., Disp: , Rfl:  .  Fish Oil-Cholecalciferol (FISH OIL + D3) 1200-1000 MG-UNIT CAPS, Take by mouth., Disp: , Rfl:  .  loratadine (CLARITIN) 10 MG tablet, Take 10 mg by mouth daily as needed. , Disp: , Rfl:  .  LUTEIN PO, Take 25 mg by mouth daily., Disp: , Rfl:  .  Multiple Vitamins-Minerals (WOMENS ONE DAILY PO), Take by mouth., Disp: , Rfl:  .  OVER THE COUNTER MEDICATION, Keratin (something for hair,etc.), Disp: , Rfl:  .  OVER THE COUNTER MEDICATION, Nucific probiotic, Disp: , Rfl:  .  Red Yeast Rice 600 MG CAPS, Take by mouth daily. , Disp: , Rfl:   EXAM:  Vitals:   08/20/17 1056  BP: 100/70  Pulse: 78  Temp: 98.7 F (37.1 C)    Body mass index is 25.7 kg/m.  GENERAL:  vitals reviewed and listed above, alert, oriented, appears well hydrated and in no acute distress  HEENT: atraumatic, conjunttiva clear, no obvious abnormalities on inspection of external nose and ears  NECK: no obvious masses on inspection  LUNGS: clear to auscultation bilaterally, no wheezes, rales or rhonchi, good air movement  CV: HRRR, no peripheral edema  MS: moves all extremities without noticeable abnormality  PSYCH: pleasant and cooperative, no obvious depression or anxiety  ASSESSMENT AND PLAN:  Discussed the following assessment and plan:  Hyperlipidemia, unspecified hyperlipidemia type - Plan: Lipid panel  Hyperglycemia - Plan: Hemoglobin A1c  BMI 25.0-25.9,adult  -Labs per orders, she is not fasting today so she will schedule lab visit for this -Discussed healthy diet at length and suggested the Mediterranean diet would be probably the best for her metabolic profile - congratulated her on impressive changes! -She declined all vaccines -Patient advised to return or notify a doctor immediately if symptoms worsen or persist or new concerns arise.  Patient Instructions  BEFORE YOU LEAVE: -follow up:  1) lab visit in next few weeks for fasting labs 2) AWV with [name] and follow up with Dr. Maudie Mercury in June 2019  We have ordered labs or studies at this visit. It can take up  to 1-2 weeks for results and processing. IF results require follow up or explanation, we will call you with instructions. Clinically stable results will be released to your [facility]. If you have not heard from [place] or cannot find your results in [facility] in 2 weeks please contact our office at [phone].  If you are not yet signed up for [facility], please consider signing up.   We recommend the following healthy lifestyle for LIFE: 1) Regular healthy meals.   2) Eat a healthy clean diet.   TRY TO EAT: -at least 5-7 servings of low sugar vegetables per day (not corn, potatoes or bananas.) -berries are  the best choice if you wish to eat fruit.   -lean meets (fish, chicken or [place] breasts) -vegan proteins for some meals - beans or tofu, whole grains, nuts and seeds -Replace bad fats with good fats - good fats include: fish, nuts and seeds, canola oil, olive oil -small amounts of low fat or non fat dairy -small amounts of100 % whole grains - check the lables  AVOID: -SUGAR, sweets, anything with added sugar, corn syrup or sweeteners -if you must have a sweetener, small amounts of stevia may be best -sweetened beverages -simple starches (rice, bread, potatoes, pasta, chips, etc - small amounts of 100% whole grains are ok) -red meat, pork, butter -fried foods, fast food, processed food, excessive dairy, eggs and coconut.  3)Get at least 150 minutes of sweaty aerobic exercise per week.  4)Reduce stress - consider counseling, meditation and relaxation to balance other aspects of your life.    Colin Benton R., DO

## 2017-08-20 ENCOUNTER — Ambulatory Visit (INDEPENDENT_AMBULATORY_CARE_PROVIDER_SITE_OTHER): Payer: Medicare Other | Admitting: Family Medicine

## 2017-08-20 ENCOUNTER — Encounter: Payer: Self-pay | Admitting: Family Medicine

## 2017-08-20 VITALS — BP 100/70 | HR 78 | Temp 98.7°F | Ht 61.0 in | Wt 136.0 lb

## 2017-08-20 DIAGNOSIS — R739 Hyperglycemia, unspecified: Secondary | ICD-10-CM | POA: Diagnosis not present

## 2017-08-20 DIAGNOSIS — E785 Hyperlipidemia, unspecified: Secondary | ICD-10-CM | POA: Diagnosis not present

## 2017-08-20 DIAGNOSIS — Z6825 Body mass index (BMI) 25.0-25.9, adult: Secondary | ICD-10-CM

## 2017-08-20 NOTE — Patient Instructions (Addendum)
BEFORE YOU LEAVE: -follow up:  1) lab visit in next few weeks for fasting labs 2) AWV with [name] and follow up with Dr. Maudie Mercury in June 2019  We have ordered labs or studies at this visit. It can take up to 1-2 weeks for results and processing. IF results require follow up or explanation, we will call you with instructions. Clinically stable results will be released to your [facility]. If you have not heard from [place] or cannot find your results in [facility] in 2 weeks please contact our office at [phone].  If you are not yet signed up for [facility], please consider signing up.   We recommend the following healthy lifestyle for LIFE: 1) Regular healthy meals.   2) Eat a healthy clean diet.   TRY TO EAT: -at least 5-7 servings of low sugar vegetables per day (not corn, potatoes or bananas.) -berries are the best choice if you wish to eat fruit.   -lean meets (fish, chicken or [place] breasts) -vegan proteins for some meals - beans or tofu, whole grains, nuts and seeds -Replace bad fats with good fats - good fats include: fish, nuts and seeds, canola oil, olive oil -small amounts of low fat or non fat dairy -small amounts of100 % whole grains - check the lables  AVOID: -SUGAR, sweets, anything with added sugar, corn syrup or sweeteners -if you must have a sweetener, small amounts of stevia may be best -sweetened beverages -simple starches (rice, bread, potatoes, pasta, chips, etc - small amounts of 100% whole grains are ok) -red meat, pork, butter -fried foods, fast food, processed food, excessive dairy, eggs and coconut.  3)Get at least 150 minutes of sweaty aerobic exercise per week.  4)Reduce stress - consider counseling, meditation and relaxation to balance other aspects of your life.

## 2017-08-26 ENCOUNTER — Telehealth: Payer: Self-pay | Admitting: *Deleted

## 2017-08-26 ENCOUNTER — Telehealth: Payer: Self-pay

## 2017-08-26 NOTE — Telephone Encounter (Signed)
Nichole Chandler left VM to call her back regarding med note. Outreach today 10/03 States she noted on her last PE with Dr. Maudie Mercury; No 4 discussed stress and counseling and thought Dr. Maudie Mercury felt she was stressed.  Explained that was not the case. Her Psych exam was normal and that this was entered under suggestions only for a "health life".   This was not specifically pertaining to her. Was satisfied with the clarification.  Wynetta Fines RN

## 2017-08-26 NOTE — Telephone Encounter (Signed)
Patient requested to speak with Nichole Chandler regarding a clarification she did not understand on her report. Please call the patient [phone]  Patient requested to speak to only Nichole Chandler being she was the one who done her wellness with her.

## 2017-09-04 ENCOUNTER — Other Ambulatory Visit (INDEPENDENT_AMBULATORY_CARE_PROVIDER_SITE_OTHER): Payer: Medicare Other

## 2017-09-04 DIAGNOSIS — R739 Hyperglycemia, unspecified: Secondary | ICD-10-CM

## 2017-09-04 DIAGNOSIS — E785 Hyperlipidemia, unspecified: Secondary | ICD-10-CM | POA: Diagnosis not present

## 2017-09-04 LAB — LIPID PANEL
Cholesterol: 223 mg/dL — ABNORMAL HIGH (ref 0–200)
HDL: 67.5 mg/dL (ref >39.00)
LDL Cholesterol: 138 mg/dL — ABNORMAL HIGH (ref 0–99)
NONHDL: 155.16
Total CHOL/HDL Ratio: 3
Triglycerides: 85 mg/dL (ref 0.0–149.0)
VLDL: 17 mg/dL (ref 0.0–40.0)

## 2017-09-04 LAB — HEMOGLOBIN A1C: HEMOGLOBIN A1C: 6 % (ref 4.6–6.5)

## 2017-12-03 ENCOUNTER — Encounter: Payer: Self-pay | Admitting: Family Medicine

## 2017-12-21 DIAGNOSIS — H6123 Impacted cerumen, bilateral: Secondary | ICD-10-CM | POA: Diagnosis not present

## 2018-01-23 ENCOUNTER — Encounter: Payer: Self-pay | Admitting: Family Medicine

## 2018-01-25 ENCOUNTER — Other Ambulatory Visit: Payer: Self-pay | Admitting: *Deleted

## 2018-01-25 DIAGNOSIS — D229 Melanocytic nevi, unspecified: Secondary | ICD-10-CM

## 2018-03-02 ENCOUNTER — Telehealth: Payer: Self-pay | Admitting: *Deleted

## 2018-03-02 ENCOUNTER — Other Ambulatory Visit: Payer: Self-pay | Admitting: *Deleted

## 2018-03-02 ENCOUNTER — Other Ambulatory Visit: Payer: Medicare Other

## 2018-03-02 DIAGNOSIS — R739 Hyperglycemia, unspecified: Secondary | ICD-10-CM

## 2018-03-02 DIAGNOSIS — E785 Hyperlipidemia, unspecified: Secondary | ICD-10-CM

## 2018-03-02 NOTE — Telephone Encounter (Signed)
Orders entered and message forwarded to [facility].

## 2018-03-02 NOTE — Telephone Encounter (Signed)
Nichole Chandler stated she spoke with the pt and she stated she wants an A1C done as well in addition to the lipid panel - its been 6  months - she has lost more weight and has been watching her sugar intake.  Patient is coming in tomorrow.  Message sent to Dr Maudie Mercury.

## 2018-03-02 NOTE — Telephone Encounter (Signed)
Okay 

## 2018-03-03 ENCOUNTER — Other Ambulatory Visit (INDEPENDENT_AMBULATORY_CARE_PROVIDER_SITE_OTHER): Payer: Medicare Other

## 2018-03-03 DIAGNOSIS — E785 Hyperlipidemia, unspecified: Secondary | ICD-10-CM | POA: Diagnosis not present

## 2018-03-03 DIAGNOSIS — D229 Melanocytic nevi, unspecified: Secondary | ICD-10-CM | POA: Diagnosis not present

## 2018-03-03 DIAGNOSIS — R58 Hemorrhage, not elsewhere classified: Secondary | ICD-10-CM | POA: Diagnosis not present

## 2018-03-03 DIAGNOSIS — L570 Actinic keratosis: Secondary | ICD-10-CM | POA: Diagnosis not present

## 2018-03-03 DIAGNOSIS — D489 Neoplasm of uncertain behavior, unspecified: Secondary | ICD-10-CM | POA: Diagnosis not present

## 2018-03-03 DIAGNOSIS — R739 Hyperglycemia, unspecified: Secondary | ICD-10-CM | POA: Diagnosis not present

## 2018-03-03 LAB — LIPID PANEL
CHOL/HDL RATIO: 3
Cholesterol: 233 mg/dL — ABNORMAL HIGH (ref 0–200)
HDL: 75.7 mg/dL (ref >39.00)
LDL CALC: 146 mg/dL — AB (ref 0–99)
NONHDL: 156.95
Triglycerides: 56 mg/dL (ref 0.0–149.0)
VLDL: 11.2 mg/dL (ref 0.0–40.0)

## 2018-03-03 LAB — HEMOGLOBIN A1C: Hgb A1c MFr Bld: 6 % (ref 4.6–6.5)

## 2018-04-09 DIAGNOSIS — H40013 Open angle with borderline findings, low risk, bilateral: Secondary | ICD-10-CM | POA: Diagnosis not present

## 2018-04-30 ENCOUNTER — Ambulatory Visit: Payer: Medicare Other

## 2018-05-17 NOTE — Progress Notes (Signed)
HPI:  Using dictation device. Unfortunately this device frequently misinterprets words/phrases.  Nichole Chandler is a pleasant 72 y.o. here for follow up. Chronic medical problems summarized below were reviewed for changes and stability and were updated as needed below. These issues and their treatment remain stable for the most part. Reports physically doing great. Reports she has made significant changes to diet and has cut out sugar and junk and is eating a healthy low sugar whole foods diet and getting regular exercise. She is very pleased that she has had some wt reduction with this. Does not want to take medications for her cholesterol. Strong family hx of cholesterol issues. Sister battling ovarian cancer - genetic testing underway and she may consider screening. Denies CP, SOB, DOE, treatment intolerance or new symptoms.   AWV 05/18/18 with [name] (today) Hyperlipidemia: -advised lifestyle changes and consideration statin -she opted to try fish oil and red yeas rice  Hyperglycemia: -advised lifestyle changes  Osteopenia: -advised vit D3 and weight bearing exercise, healthy diet with adequate calcium -last bone density 05/2017  ROS: See pertinent positives and negatives per HPI.  Past Medical History:  Diagnosis Date  . Hyperlipidemia   . Squamous cell carcinoma     No past surgical history on file.  Family History  Problem Relation Age of Onset  . Kidney cancer Sister        RENAL CARCINOMA  . Lung cancer Sister        smoker  . Heart disease Father     SOCIAL HX: see hpi   Current Outpatient Medications:  .  Coenzyme Q10 (COQ10 PO), Take 200 mg by mouth daily., Disp: , Rfl:  .  Fish Oil-Cholecalciferol (FISH OIL + D3) 1200-1000 MG-UNIT CAPS, Take by mouth., Disp: , Rfl:  .  loratadine (CLARITIN) 10 MG tablet, Take 10 mg by mouth daily as needed. , Disp: , Rfl:  .  LUTEIN PO, Take 25 mg by mouth daily., Disp: , Rfl:  .  Multiple Vitamins-Minerals (WOMENS ONE  DAILY PO), Take by mouth., Disp: , Rfl:  .  OVER THE COUNTER MEDICATION, Keratin (something for hair,etc.), Disp: , Rfl:  .  OVER THE COUNTER MEDICATION, Nucific probiotic, Disp: , Rfl:  .  Red Yeast Rice 600 MG CAPS, Take by mouth daily. , Disp: , Rfl:   EXAM:  Vitals:   05/18/18 0853  BP: 118/68  Pulse: 78  SpO2: 98%    Body mass index is 23.63 kg/m.  GENERAL: vitals reviewed and listed above, alert, oriented, appears well hydrated and in no acute distress  HEENT: atraumatic, conjunttiva clear, no obvious abnormalities on inspection of external nose and ears  NECK: no obvious masses on inspection  LYMPH: no LAD  LUNGS: clear to auscultation bilaterally, no wheezes, rales or rhonchi, good air movement  CV: HRRR, no peripheral edema  MS: moves all extremities without noticeable abnormality  PSYCH: pleasant and cooperative, no obvious depression or anxiety  ASSESSMENT AND PLAN:  Discussed the following assessment and plan:  Hyperlipidemia, unspecified hyperlipidemia type  Hyperglycemia  Osteopenia, unspecified location  -congratulated on lifestyle changes and success -Encouraged a lifelong commitment to a healthy diet and regular exercise -Supported regarding devastating diagnosis and sister and discussed that the cancer center may offer ovarian cancer screening, advised regular preventive care measures and she also may consider seeing a gynecologist, she is waiting on the results of the genetic test with her sister -Blood pressure is a little elevated on arrival, but was normal  on recheck -She does not wish to take a statin for cholesterol, we discussed risks with supplements, she prefers to continue these and healthy diet regular exercise -follow-up 4 to 6 months or sooner as needed -Patient advised to return or notify a doctor immediately if symptoms worsen or persist or new concerns arise.  Patient Instructions  BEFORE YOU LEAVE: -follow up: 4-6  month  Continue a healthy diet and regular exercise. Weight is great right now.   If any concerns please follow up sooner.    We recommend the following healthy lifestyle for LIFE: 1) Small portions. But, make sure to get regular (at least 3 per day), healthy meals and small healthy snacks if needed.  2) Eat a healthy clean diet.   TRY TO EAT: -at least 5-7 servings of low sugar, colorful, and nutrient rich vegetables per day (not corn, potatoes or bananas.) -berries are the best choice if you wish to eat fruit (only eat small amounts if trying to reduce weight)  -lean meets (fish, white meat of chicken or [place]) -vegan proteins for some meals - beans or tofu, whole grains, nuts and seeds -Replace bad fats with good fats - good fats include: fish, nuts and seeds, canola oil, olive oil -small amounts of low fat or non fat dairy -small amounts of100 % whole grains - check the lables -drink plenty of water  AVOID: -SUGAR, sweets, anything with added sugar, corn syrup or sweeteners - must read labels as even foods advertised as "healthy" often are loaded with sugar -if you must have a sweetener, small amounts of stevia may be best -sweetened beverages and artificially sweetened beverages -simple starches (rice, bread, potatoes, pasta, chips, etc - small amounts of 100% whole grains are ok) -red meat, pork, butter -fried foods, fast food, processed food, excessive dairy, eggs and coconut.  3)Get at least 150 minutes of sweaty aerobic exercise per week.  4)Reduce stress - consider counseling, meditation and relaxation to balance other aspects of your life.     Lucretia Kern, DO

## 2018-05-17 NOTE — Progress Notes (Signed)
Subjective:   Nichole Chandler is a 72 y.o. female who presents for Medicare Annual (Subsequent) preventive examination.  Reports health as good   Just retired officially in Dec 2017 Was a Psychologist, sport and exercise;  Secondary school teacher; started acquisition plan  Difficult to fup on preventive health due to sale of business and death of family member but will catch up now  Nichole Chandler her sister has ovarian cancer (85)   Just dx  Spread to bowel; liver and lung  6 sister of 91; one sister deceased Very anxious and sad today   Psychosocial Lost spouse to leukemia  lost her mother 6 months later  Father died at 66 of MI and he ate a terrible diet    Diet 23.6  Dropping a pound or 2 q couple of weeks No issues, just busy and watching her diet  Chol 233; hdl 75; trig 56 a1c 6.0 at last check, she is aware  Is monitoring chol;  Stopped eating sausage  Breakfast; toast and avocado; Lunch; very light; protein drink Supper; eat out some  With Nichole Chandler being sick, she is not eating as much and staying with her as much as possible   Regular exercise  Yard work, [employer]; gardening while at [employer] in the summer  Takes care of her yard Now she is absorbed with taking care of her sister    There are no preventive care reminders to display for this patient.  Mammogram 05/2017 Dexa 05/2017  -1.6 Discussed shingrix last year  cologuard 04/2017 - 06/2020  Eye pressure is 20  Will recheck for visual field  Cardiac Risk Factors include: advanced age (>42men, >41 women);family history of premature cardiovascular disease     Objective:     Vitals: BP 140/80   Pulse 78   Ht 5' (1.524 m)   Wt 121 lb (54.9 kg)   SpO2 98%   BMI 23.63 kg/m   Body mass index is 23.63 kg/m.  Advanced Directives 05/18/2018 04/29/2017  Does Patient Have a Medical Advance Directive? Yes No    Tobacco Social History   Tobacco Use  Smoking Status Never Smoker  Smokeless Tobacco Never Used       Counseling given: Yes   Clinical Intake:    Past Medical History:  Diagnosis Date  . Hyperlipidemia   . Squamous cell carcinoma    No past surgical history on file. Family History  Problem Relation Age of Onset  . Kidney cancer Sister        RENAL CARCINOMA  . Lung cancer Sister        smoker  . Heart disease Father    Social History   Socioeconomic History  . Marital status: Widowed    Spouse name: Not on file  . Number of children: Not on file  . Years of education: Not on file  . Highest education level: Not on file  Occupational History  . Not on file  Social Needs  . Financial resource strain: Not on file  . Food insecurity:    Worry: Not on file    Inability: Not on file  . Transportation needs:    Medical: Not on file    Non-medical: Not on file  Tobacco Use  . Smoking status: Never Smoker  . Smokeless tobacco: Never Used  Substance and Sexual Activity  . Alcohol use: No  . Drug use: Not on file  . Sexual activity: Not on file  Lifestyle  . Physical activity:  Days per week: Not on file    Minutes per session: Not on file  . Stress: Not on file  Relationships  . Social connections:    Talks on phone: Not on file    Gets together: Not on file    Attends religious service: Not on file    Active member of club or organization: Not on file    Attends meetings of clubs or organizations: Not on file    Relationship status: Not on file  Other Topics Concern  . Not on file  Social History Narrative  . Not on file    Outpatient Encounter Medications as of 05/18/2018  Medication Sig  . Coenzyme Q10 (COQ10 PO) Take 200 mg by mouth daily.  . Fish Oil-Cholecalciferol (FISH OIL + D3) 1200-1000 MG-UNIT CAPS Take by mouth.  . loratadine (CLARITIN) 10 MG tablet Take 10 mg by mouth daily as needed.   . LUTEIN PO Take 25 mg by mouth daily.  . Multiple Vitamins-Minerals (WOMENS ONE DAILY PO) Take by mouth.  Marland Kitchen OVER THE COUNTER MEDICATION Nucific probiotic   . Red Yeast Rice 600 MG CAPS Take by mouth daily.   Marland Kitchen OVER THE COUNTER MEDICATION Keratin (something for hair,etc.)   No facility-administered encounter medications on file as of 05/18/2018.     Activities of Daily Living In your present state of health, do you have any difficulty performing the following activities: 05/18/2018  Hearing? N  Vision? N  Difficulty concentrating or making decisions? N  Walking or climbing stairs? N  Dressing or bathing? N  Doing errands, shopping? N  Preparing Food and eating ? N  Using the Toilet? N  In the past six months, have you accidently leaked urine? N  Do you have problems with loss of bowel control? N  Managing your Medications? N  Managing your Finances? N  Some recent data might be hidden    Patient Care Team: Lucretia Kern, DO as PCP - General (Family Medicine)    Assessment:   This is a routine wellness examination for Nichole Chandler.  Exercise Activities and Dietary recommendations Current Exercise Habits: Home exercise routine, Time (Minutes): 60(more walking and dancing at the beach ), Frequency (Times/Week): 4, Weekly Exercise (Minutes/Week): 240, Intensity: Moderate  Goals    . Patient Stated     Long term goal is to move to the beach; still shagging at the beach.         Fall Risk Fall Risk  05/18/2018 04/29/2017  Falls in the past year? No No    Depression Screen PHQ 2/9 Scores 05/18/2018 04/29/2017  PHQ - 2 Score 0 0     Cognitive Function MMSE - Mini Mental State Exam 05/18/2018 04/29/2017  Not completed: (No Data) (No Data)     Ad8 score reviewed for issues:  Issues making decisions:  Less interest in hobbies / activities:  Repeats questions, stories (family complaining):  Trouble using ordinary gadgets (microwave, computer, phone):  Forgets the month or year:   Mismanaging finances:   Remembering appts:  Daily problems with thinking and/or memory: Ad8 score is=0        Immunization History   Administered Date(s) Administered  . Td 03/24/2017      Screening Tests Health Maintenance  Topic Date Due  . INFLUENZA VACCINE  08/20/2022 (Originally 06/24/2018)  . PNA vac Low Risk Adult (1 of 2 - PCV13) 08/20/2022 (Originally 06/18/2013)  . MAMMOGRAM  06/24/2019  . Fecal DNA (Cologuard)  07/06/2020  .  TETANUS/TDAP  03/25/2027  . DEXA SCAN  Completed  . Hepatitis C Screening  Completed         Plan:      PCP Notes   Health Maintenance Agreed to take the pneumonia series; Prevnar 13 given today  Will schedule her mammogram for early August;  [place] and she is aware; eats well but given resources to check on calcium intake  Discussed starting yoga routine   Abnormal Screens  none  Referrals  None- lost 15 pounds since Sept 2018 but was very active when at the beach this year, has cut out sweets and eats vegetables and fruits   Patient concerns; None; has dropped   Nurse Concerns; As noted   Next PCP apt Ms [place] is being seen today       I have personally reviewed and noted the following in the patient's chart:   . Medical and social history . Use of alcohol, tobacco or illicit drugs  . Current medications and supplements . Functional ability and status . Nutritional status . Physical activity . Advanced directives . List of other physicians . Hospitalizations, surgeries, and ER visits in previous 12 months . Vitals . Screenings to include cognitive, depression, and falls . Referrals and appointments  In addition, I have reviewed and discussed with patient certain preventive protocols, quality metrics, and best practice recommendations. A written personalized care plan for preventive services as well as general preventive health recommendations were provided to patient.     Wynetta Fines, RN  05/18/2018

## 2018-05-18 ENCOUNTER — Encounter: Payer: Self-pay | Admitting: Family Medicine

## 2018-05-18 ENCOUNTER — Ambulatory Visit (INDEPENDENT_AMBULATORY_CARE_PROVIDER_SITE_OTHER): Payer: Medicare Other | Admitting: Family Medicine

## 2018-05-18 ENCOUNTER — Ambulatory Visit (INDEPENDENT_AMBULATORY_CARE_PROVIDER_SITE_OTHER): Payer: Medicare Other

## 2018-05-18 VITALS — BP 118/68 | HR 78 | Ht 60.0 in | Wt 121.0 lb

## 2018-05-18 DIAGNOSIS — R739 Hyperglycemia, unspecified: Secondary | ICD-10-CM | POA: Diagnosis not present

## 2018-05-18 DIAGNOSIS — M8580 Other specified disorders of bone density and structure, unspecified site: Secondary | ICD-10-CM | POA: Diagnosis not present

## 2018-05-18 DIAGNOSIS — E785 Hyperlipidemia, unspecified: Secondary | ICD-10-CM

## 2018-05-18 DIAGNOSIS — Z0000 Encounter for general adult medical examination without abnormal findings: Secondary | ICD-10-CM

## 2018-05-18 DIAGNOSIS — Z23 Encounter for immunization: Secondary | ICD-10-CM | POA: Diagnosis not present

## 2018-05-18 NOTE — Patient Instructions (Signed)
BEFORE YOU LEAVE: -follow up: 4-6 month  Continue a healthy diet and regular exercise. Weight is great right now.   If any concerns please follow up sooner.    We recommend the following healthy lifestyle for LIFE: 1) Small portions. But, make sure to get regular (at least 3 per day), healthy meals and small healthy snacks if needed.  2) Eat a healthy clean diet.   TRY TO EAT: -at least 5-7 servings of low sugar, colorful, and nutrient rich vegetables per day (not corn, potatoes or bananas.) -berries are the best choice if you wish to eat fruit (only eat small amounts if trying to reduce weight)  -lean meets (fish, white meat of chicken or [place]) -vegan proteins for some meals - beans or tofu, whole grains, nuts and seeds -Replace bad fats with good fats - good fats include: fish, nuts and seeds, canola oil, olive oil -small amounts of low fat or non fat dairy -small amounts of100 % whole grains - check the lables -drink plenty of water  AVOID: -SUGAR, sweets, anything with added sugar, corn syrup or sweeteners - must read labels as even foods advertised as "healthy" often are loaded with sugar -if you must have a sweetener, small amounts of stevia may be best -sweetened beverages and artificially sweetened beverages -simple starches (rice, bread, potatoes, pasta, chips, etc - small amounts of 100% whole grains are ok) -red meat, pork, butter -fried foods, fast food, processed food, excessive dairy, eggs and coconut.  3)Get at least 150 minutes of sweaty aerobic exercise per week.  4)Reduce stress - consider counseling, meditation and relaxation to balance other aspects of your life.

## 2018-05-18 NOTE — Progress Notes (Signed)
Hannah R Kim, DO  

## 2018-05-18 NOTE — Patient Instructions (Addendum)
Nichole Chandler , Thank you for taking time to come for your Medicare Wellness Visit. I appreciate your ongoing commitment to your health goals. Please review the following plan we discussed and let me know if I can assist you in the future.   Shingrix is a vaccine for the prevention of Shingles in Adults 50 and older.  If you are on Medicare, the shingrix is covered under your Part D plan, so you will take both of the vaccines in the series at your pharmacy. Please check with your benefits regarding applicable copays or out of pocket expenses.  The Shingrix is given in 2 vaccines approx 8 weeks apart. You must receive the 2nd dose prior to 6 months from receipt of the first. Please have the pharmacist print out you Immunization  dates for our office records   The Centers for Disease Control are now recommending 2 pneumonia vaccinations after 54. The first is the Prevnar 13. This helps to boost your immunity to community acquired pneumonia as well as some protection from bacterial pneumonia  The 2nd is the pneumovax 23, which offers more broad protection!  Please consider taking these as this is your best protection against pneumonia.  Will take the prevnar today!    Recommendations for Dexa Scan Female over the age of 61 Man age 17 or older If you broke a bone past the age of 32 Women menopausal age with risk factors (thin frame; smoker; hx of fx ) Post menopausal women under the age of 49 with risk factors A man age 68 to 60 with risk factors Other: Spine xray that is showing break of bone loss Back pain with possible break Height loss of 1/2 inch or more within one year Total loss in height of 1.5 inches from your original height  Calcium 1234m with Vit D 800u per day; more as directed by physician Strength building exercises discussed; can include walking; housework; small weights or stretch bands; silver sneakers if access to the Y  Please visit the osteoporosis foundation.org for  up to date recommendations  You will need repeat sometimes around July 31st and will schedule for the one in August  Would recommend the digital and normally this is what you get       These are the goals we discussed: Goals    . Patient Stated     [place] term [place] shagging [place] the beach.         This is a list of the screening recommended for you and due dates:  Health Maintenance  Topic Date Due  . Flu Shot  08/20/2022*  . Pneumonia vaccines (1 of 2 - PCV13) 08/20/2022*  . Mammogram  06/24/2019  . Cologuard (Stool DNA test)  07/06/2020  . Tetanus Vaccine  03/25/2027  . DEXA scan (bone density measurement)  Completed  .  Hepatitis C: One time screening is recommended by Center for Disease Control  (CDC) for  adults born from [birth year]through 1965.   Completed  *Topic was postponed. The date shown is not the original due date.      Fall Prevention in the Home Falls can cause injuries. They can happen to people of all ages. There are many things you can do to make your home safe and to help prevent falls. What can I do on the outside of my home?  Regularly fix the edges of walkways and driveways and fix any cracks.  Remove anything that  might make you trip as you walk through a door, such as a raised step or threshold.  Trim any bushes or trees on the path to your home.  Use bright outdoor lighting.  Clear any walking paths of anything that might make someone trip, such as rocks or tools.  Regularly check to see if handrails are loose or broken. Make sure that both sides of any steps have handrails.  Any raised decks and porches should have guardrails on the edges.  Have any leaves, snow, or ice cleared regularly.  Use sand or salt on walking paths during winter.  Clean up any spills in your garage right away. This includes oil or grease spills. What can I do in the bathroom?  Use night lights.  Install grab bars by the toilet and in  the tub and shower. Do not use towel bars as grab bars.  Use non-skid mats or decals in the tub or shower.  If you need to sit down in the shower, use a plastic, non-slip stool.  Keep the floor dry. Clean up any water that spills on the floor as soon as it happens.  Remove soap buildup in the tub or shower regularly.  Attach bath mats securely with double-sided non-slip rug tape.  Do not have throw rugs and other things on the floor that can make you trip. What can I do in the bedroom?  Use night lights.  Make sure that you have a light by your bed that is easy to reach.  Do not use any sheets or blankets that are too big for your bed. They should not hang down onto the floor.  Have a firm chair that has side arms. You can use this for support while you get dressed.  Do not have throw rugs and other things on the floor that can make you trip. What can I do in the kitchen?  Clean up any spills right away.  Avoid walking on wet floors.  Keep items that you use a lot in easy-to-reach places.  If you need to reach something above you, use a strong step stool that has a grab bar.  Keep electrical cords out of the way.  Do not use floor polish or wax that makes floors slippery. If you must use wax, use non-skid floor wax.  Do not have throw rugs and other things on the floor that can make you trip. What can I do with my stairs?  Do not leave any items on the stairs.  Make sure that there are handrails on both sides of the stairs and use them. Fix handrails that are broken or loose. Make sure that handrails are as long as the stairways.  Check any carpeting to make sure that it is firmly attached to the stairs. Fix any carpet that is loose or worn.  Avoid having throw rugs at the top or bottom of the stairs. If you do have throw rugs, attach them to the floor with carpet tape.  Make sure that you have a light switch at the top of the stairs and the bottom of the stairs. If  you do not have them, ask someone to add them for you. What else can I do to help prevent falls?  Wear shoes that: ? Do not have high heels. ? Have rubber bottoms. ? Are comfortable and fit you well. ? Are closed at the toe. Do not wear sandals.  If you use a stepladder: ? Make sure that  it is fully opened. Do not climb a closed stepladder. ? Make sure that both sides of the stepladder are locked into place. ? Ask someone to hold it for you, if possible.  Clearly mark and make sure that you can see: ? Any grab bars or handrails. ? First and last steps. ? Where the edge of each step is.  Use tools that help you move around (mobility aids) if they are needed. These include: ? Canes. ? Walkers. ? Scooters. ? Crutches.  Turn on the lights when you go into a dark area. Replace any light bulbs as soon as they burn out.  Set up your furniture so you have a clear path. Avoid moving your furniture around.  If any of your floors are uneven, fix them.  If there are any pets around you, be aware of where they are.  Review your medicines with your doctor. Some medicines can make you feel dizzy. This can increase your chance of falling. Ask your doctor what other things that you can do to help prevent falls. This information is not intended to replace advice given to you by your health care provider. Make sure you discuss any questions you have with your health care provider. Document Released: 09/06/2009 Document Revised: 04/17/2016 Document Reviewed: 12/15/2014 Elsevier Interactive Patient Education  2018 [place] Maintenance, Female Adopting a healthy lifestyle and getting preventive care can go a long way to promote health and wellness. Talk with your health care provider about what schedule of regular examinations is right for you. This is a good chance for you to check in with your provider about disease prevention and staying healthy. In between checkups, there are  plenty of things you can do on your own. Experts have done a lot of research about which lifestyle changes and preventive measures are most likely to keep you healthy. Ask your health care provider for more information. Weight and diet Eat a healthy diet  Be sure to include plenty of vegetables, fruits, low-fat dairy products, and lean protein.  Do not eat a lot of foods high in solid fats, added sugars, or salt.  Get regular exercise. This is one of the most important things you can do for your health. ? Most adults should exercise for at least 150 minutes each week. The exercise should increase your heart rate and make you sweat (moderate-intensity exercise). ? Most adults should also do strengthening exercises at least twice a week. This is in addition to the moderate-intensity exercise.  Maintain a healthy weight  Body mass index (BMI) is a measurement that can be used to identify possible weight problems. It estimates body fat based on height and weight. Your health care provider can help determine your BMI and help you achieve or maintain a healthy weight.  For females 93 years of age and older: ? A BMI below 18.5 is considered underweight. ? A BMI of 18.5 to 24.9 is normal. ? A BMI of 25 to 29.9 is considered overweight. ? A BMI of 30 and above is considered obese.  Watch levels of cholesterol and blood lipids  You should start having your blood tested for lipids and cholesterol at 70 years of age, then have this test every 5 years.  You may need to have your cholesterol levels checked more often if: ? Your lipid or cholesterol levels are high. ? You are older than 70 years of age. ? You are at high risk for heart disease.  Cancer  screening Lung Cancer  Lung cancer screening is recommended for adults 41-67 years old who are at high risk for lung cancer because of a history of smoking.  A yearly low-dose CT scan of the lungs is recommended for people who: ? Currently  smoke. ? Have quit within the past 15 years. ? Have at least a 30-pack-year history of smoking. A pack year is smoking an average of one pack of cigarettes a day for 1 year.  Yearly screening should continue until it has been 15 years since you quit.  Yearly screening should stop if you develop a health problem that would prevent you from having lung cancer treatment.  Breast Cancer  Practice breast self-awareness. This means understanding how your breasts normally appear and feel.  It also means doing regular breast self-exams. Let your health care provider know about any changes, no matter how small.  If you are in your 20s or 30s, you should have a clinical breast exam (CBE) by a health care provider every 1-3 years as part of a regular health exam.  If you are 41 or older, have a CBE every year. Also consider having a breast X-ray (mammogram) every year.  If you have a family history of breast cancer, talk to your health care provider about genetic screening.  If you are at high risk for breast cancer, talk to your health care provider about having an MRI and a mammogram every year.  Breast cancer gene (BRCA) assessment is recommended for women who have family members with BRCA-related cancers. BRCA-related cancers include: ? Breast. ? Ovarian. ? Tubal. ? Peritoneal cancers.  Results of the assessment will determine the need for genetic counseling and BRCA1 and BRCA2 testing.  Cervical Cancer Your health care provider may recommend that you be screened regularly for cancer of the pelvic organs (ovaries, uterus, and vagina). This screening involves a pelvic examination, including checking for microscopic changes to the surface of your cervix (Pap test). You may be encouraged to have this screening done every 3 years, beginning at age 96.  For women ages 63-65, health care providers may recommend pelvic exams and Pap testing every 3 years, or they may recommend the Pap and pelvic  exam, combined with testing for human papilloma virus (HPV), every 5 years. Some types of HPV increase your risk of cervical cancer. Testing for HPV may also be done on women of any age with unclear Pap test results.  Other health care providers may not recommend any screening for nonpregnant women who are considered low risk for pelvic cancer and who do not have symptoms. Ask your health care provider if a screening pelvic exam is right for you.  If you have had past treatment for cervical cancer or a condition that could lead to cancer, you need Pap tests and screening for cancer for at least 20 years after your treatment. If Pap tests have been discontinued, your risk factors (such as having a new sexual partner) need to be reassessed to determine if screening should resume. Some women have medical problems that increase the chance of getting cervical cancer. In these cases, your health care provider may recommend more frequent screening and Pap tests.  Colorectal Cancer  This type of cancer can be detected and often prevented.  Routine colorectal cancer screening usually begins at 70 years of age and continues through 70 years of age.  Your health care provider may recommend screening at an earlier age if you have risk factors for  colon cancer.  Your health care provider may also recommend using home test kits to check for hidden blood in the stool.  A small camera at the end of a tube can be used to examine your colon directly (sigmoidoscopy or colonoscopy). This is done to check for the earliest forms of colorectal cancer.  Routine screening usually begins at age 47.  Direct examination of the colon should be repeated every 5-10 years through 70 years of age. However, you may need to be screened more often if early forms of precancerous polyps or small growths are found.  Skin Cancer  Check your skin from head to toe regularly.  Tell your health care provider about any new moles or  changes in moles, especially if there is a change in a mole's shape or color.  Also tell your health care provider if you have a mole that is larger than the size of a pencil eraser.  Always use sunscreen. Apply sunscreen liberally and repeatedly throughout the day.  Protect yourself by wearing long sleeves, pants, a wide-brimmed hat, and sunglasses whenever you are outside.  Heart disease, diabetes, and high blood pressure  High blood pressure causes heart disease and increases the risk of stroke. High blood pressure is more likely to develop in: ? People who have blood pressure in the high end of the normal range (130-139/85-89 mm Hg). ? People who are overweight or obese. ? People who are African American.  If you are 107-90 years of age, have your blood pressure checked every 3-5 years. If you are 52 years of age or older, have your blood pressure checked every year. You should have your blood pressure measured twice-once when you are at a hospital or clinic, and once when you are not at a hospital or clinic. Record the average of the two measurements. To check your blood pressure when you are not at a hospital or clinic, you can use: ? An automated blood pressure machine at a pharmacy. ? A home blood pressure monitor.  If you are between 5 years and 42 years old, ask your health care provider if you should take aspirin to prevent strokes.  Have regular diabetes screenings. This involves taking a blood sample to check your fasting blood sugar level. ? If you are at a normal weight and have a low risk for diabetes, have this test once every three years after 70 years of age. ? If you are overweight and have a high risk for diabetes, consider being tested at a younger age or more often. Preventing infection Hepatitis B  If you have a higher risk for hepatitis B, you should be screened for this virus. You are considered at high risk for hepatitis B if: ? You were born in a country where  hepatitis B is common. Ask your health care provider which countries are considered high risk. ? Your parents were born in a high-risk country, and you have not been immunized against hepatitis B (hepatitis B vaccine). ? You have HIV or AIDS. ? You use needles to inject street drugs. ? You live with someone who has hepatitis B. ? You have had sex with someone who has hepatitis B. ? You get hemodialysis treatment. ? You take certain medicines for conditions, including cancer, organ transplantation, and autoimmune conditions.  Hepatitis C  Blood testing is recommended for: ? Everyone born from [birth year] through 1963. ? Anyone with known risk factors for hepatitis C.  Sexually transmitted infections (STIs)  You  should be screened for sexually transmitted infections (STIs) including gonorrhea and chlamydia if: ? You are sexually active and are younger than 70 years of age. ? You are older than 70 years of age and your health care provider tells you that you are at risk for this type of infection. ? Your sexual activity has changed since you were last screened and you are at an increased risk for chlamydia or gonorrhea. Ask your health care provider if you are at risk.  If you do not have HIV, but are at risk, it may be recommended that you take a prescription medicine daily to prevent HIV infection. This is called pre-exposure prophylaxis (PrEP). You are considered at risk if: ? You are sexually active and do not regularly use condoms or know the HIV status of your partner(s). ? You take drugs by injection. ? You are sexually active with a partner who has HIV.  Talk with your health care provider about whether you are at high risk of being infected with HIV. If you choose to begin PrEP, you should first be tested for HIV. You should then be tested every 3 months for as long as you are taking PrEP. Pregnancy  If you are premenopausal and you may become pregnant, ask your health care provider  about preconception counseling.  If you may become pregnant, take 400 to 800 micrograms (mcg) of folic acid every day.  If you want to prevent pregnancy, talk to your health care provider about birth control (contraception). Osteoporosis and menopause  Osteoporosis is a disease in which the bones lose minerals and strength with aging. This can result in serious bone fractures. Your risk for osteoporosis can be identified using a bone density scan.  If you are 59 years of age or older, or if you are at risk for osteoporosis and fractures, ask your health care provider if you should be screened.  Ask your health care provider whether you should take a calcium or vitamin D supplement to lower your risk for osteoporosis.  Menopause may have certain physical symptoms and risks.  Hormone replacement therapy may reduce some of these symptoms and risks. Talk to your health care provider about whether hormone replacement therapy is right for you. Follow these instructions at home:  Schedule regular health, dental, and eye exams.  Stay current with your immunizations.  Do not use any tobacco products including cigarettes, chewing tobacco, or electronic cigarettes.  If you are pregnant, do not drink alcohol.  If you are breastfeeding, limit how much and how often you drink alcohol.  Limit alcohol intake to no more than 1 drink per day for nonpregnant women. One drink equals 12 ounces of beer, 5 ounces of wine, or 1 ounces of hard liquor.  Do not use street drugs.  Do not share needles.  Ask your health care provider for help if you need support or information about quitting drugs.  Tell your health care provider if you often feel depressed.  Tell your health care provider if you have ever been abused or do not feel safe at home. This information is not intended to replace advice given to you by your health care provider. Make sure you discuss any questions you have with your health care  provider. Document Released: 05/26/2011 Document Revised: 04/17/2016 Document Reviewed: 08/14/2015 Elsevier Interactive Patient Education  [employer].

## 2018-05-21 ENCOUNTER — Encounter: Payer: Self-pay | Admitting: Family Medicine

## 2018-07-27 ENCOUNTER — Other Ambulatory Visit: Payer: Self-pay | Admitting: Family Medicine

## 2018-07-27 DIAGNOSIS — Z1231 Encounter for screening mammogram for malignant neoplasm of breast: Secondary | ICD-10-CM

## 2018-07-28 ENCOUNTER — Ambulatory Visit
Admission: RE | Admit: 2018-07-28 | Discharge: 2018-07-28 | Disposition: A | Discharge status: Home / Self Care | Payer: Medicare Other | Source: Ambulatory Visit | Attending: Family Medicine | Admitting: Family Medicine

## 2018-07-28 DIAGNOSIS — Z1231 Encounter for screening mammogram for malignant neoplasm of breast: Secondary | ICD-10-CM

## 2018-09-20 NOTE — Progress Notes (Signed)
HPI:  Using dictation device. Unfortunately this device frequently misinterprets words/phrases.  Nichole Chandler Nichole Chandler is a pleasant 72 y.o. here for follow up. Chronic medical problems summarized below were reviewed for changes and stability and were updated as needed below. These issues and their treatment remain stable for the most part.  Reports doing well for the most part. Dating again. Dealing with sister's estate. Thinks may have strained R add thigh muscle - rare mild pain with certain movements for a few months. None currently. Denies CP, SOB, DOE, treatment intolerance or new symptoms. Prefers not to take medications. Prefers to return for labs. Initially refused flu shot, then wanted to consider. Due for labs. AWV 05/18/18 with [name]   Hyperlipidemia: -advised lifestyle changes and consideration statin -she opted to try fish oil and red yeast rice -she declined a statin  Hyperglycemia: -advised lifestyle changes  Osteopenia: -advised vit D3 and weight bearing exercise, healthy diet with adequate calcium -last bone density 05/2017   ROS: See pertinent positives and negatives per HPI.  Past Medical History:  Diagnosis Date  . Hyperlipidemia   . Squamous cell carcinoma     History reviewed. No pertinent surgical history.  Family History  Problem Relation Age of Onset  . Kidney cancer Sister        RENAL CARCINOMA  . Lung cancer Sister        smoker  . Heart disease Father     SOCIAL HX: see hpi   Current Outpatient Medications:  .  Coenzyme Q10 (COQ10 PO), Take 200 mg by mouth daily., Disp: , Rfl:  .  Fish Oil-Cholecalciferol (FISH OIL + D3) 1200-1000 MG-UNIT CAPS, Take by mouth., Disp: , Rfl:  .  loratadine (CLARITIN) 10 MG tablet, Take 10 mg by mouth daily as needed. , Disp: , Rfl:  .  LUTEIN PO, Take 25 mg by mouth daily., Disp: , Rfl:  .  Multiple Vitamins-Minerals (WOMENS ONE DAILY PO), Take by mouth., Disp: , Rfl:  .  OVER THE COUNTER MEDICATION,  Nucific probiotic, Disp: , Rfl:  .  Red Yeast Rice 600 MG CAPS, Take by mouth daily. , Disp: , Rfl:   EXAM:  Vitals:   09/21/18 0843  BP: (!) 112/58  Pulse: 83  Temp: 99 F (37.2 C)    Body mass index is 24.28 kg/m.  GENERAL: vitals reviewed and listed above, alert, oriented, appears well hydrated and in no acute distress  HEENT: atraumatic, conjunttiva clear, no obvious abnormalities on inspection of external nose and ears  NECK: no obvious masses on inspection  LUNGS: clear to auscultation bilaterally, no wheezes, rales or rhonchi, good air movement  CV: HRRR, no peripheral edema  MS: moves all extremities without noticeable abnormality  PSYCH: pleasant and cooperative, no obvious depression or anxiety  ASSESSMENT AND PLAN:  Discussed the following assessment and plan:  Hyperlipidemia, unspecified hyperlipidemia type - Plan: Lipid panel  Hyperglycemia - Plan: Hemoglobin A1c  Osteopenia, unspecified location  Vaccine counseling  Vitamin D deficiency - Plan: VITAMIN D 25 Hydroxy (Vit-D Deficiency, Fractures)  -lifestyle recs -discussed supplements and risks - will check vit D given bone density issues and does not want to take medications, getting exercise -adductor exercises - follow up advised if persists or any changes -return for labs and ? Flu shot after discussion risks benefits -Patient advised to return or notify a doctor immediately if symptoms worsen or persist or new concerns arise.  Patient Instructions  BEFORE YOU LEAVE: -follow YQ:MVHQIO up Dr.  Maudie Mercury and AWV [name] in June 2020 -lab appointment and possible vaccine visit in 1-4 weeks per patient preference  We have ordered labs or studies at this visit. It can take up to 1-2 weeks for results and processing. IF results require follow up or explanation, we will call you with instructions. Clinically stable results will be released to your [facility]. If you have not heard from [place] or cannot find your  results in [facility] in 2 weeks please contact our office at [phone].  If you are not yet signed up for [facility], please consider signing up.           Lucretia Kern, DO

## 2018-09-21 ENCOUNTER — Encounter: Payer: Self-pay | Admitting: Family Medicine

## 2018-09-21 ENCOUNTER — Ambulatory Visit (INDEPENDENT_AMBULATORY_CARE_PROVIDER_SITE_OTHER): Payer: Medicare Other | Admitting: Family Medicine

## 2018-09-21 VITALS — BP 112/58 | HR 83 | Temp 99.0°F | Ht 60.0 in | Wt 124.3 lb

## 2018-09-21 DIAGNOSIS — Z7189 Other specified counseling: Secondary | ICD-10-CM

## 2018-09-21 DIAGNOSIS — R739 Hyperglycemia, unspecified: Secondary | ICD-10-CM

## 2018-09-21 DIAGNOSIS — E785 Hyperlipidemia, unspecified: Secondary | ICD-10-CM | POA: Diagnosis not present

## 2018-09-21 DIAGNOSIS — E559 Vitamin D deficiency, unspecified: Secondary | ICD-10-CM | POA: Diagnosis not present

## 2018-09-21 DIAGNOSIS — M8580 Other specified disorders of bone density and structure, unspecified site: Secondary | ICD-10-CM

## 2018-09-21 DIAGNOSIS — Z7185 Encounter for immunization safety counseling: Secondary | ICD-10-CM

## 2018-09-21 NOTE — Patient Instructions (Signed)
BEFORE YOU LEAVE: -follow RV:[facility] up Dr. Maudie Mercury and AWV [name] in June 2020 -lab appointment and possible vaccine visit in 1-4 weeks per patient preference  We have ordered labs or studies at this visit. It can take up to 1-2 weeks for results and processing. IF results require follow up or explanation, we will call you with instructions. Clinically stable results will be released to your Specialists [facility]. If you have not heard from [place] or cannot find your results in [facility] in 2 weeks please contact our office at [phone].  If you are not yet signed up for [facility], please consider signing up.

## 2018-09-24 ENCOUNTER — Other Ambulatory Visit (INDEPENDENT_AMBULATORY_CARE_PROVIDER_SITE_OTHER): Payer: Medicare Other

## 2018-09-24 DIAGNOSIS — E785 Hyperlipidemia, unspecified: Secondary | ICD-10-CM

## 2018-09-24 DIAGNOSIS — E559 Vitamin D deficiency, unspecified: Secondary | ICD-10-CM | POA: Diagnosis not present

## 2018-09-24 DIAGNOSIS — R739 Hyperglycemia, unspecified: Secondary | ICD-10-CM | POA: Diagnosis not present

## 2018-09-24 LAB — LIPID PANEL
Cholesterol: 184 mg/dL (ref 0–200)
HDL: 68.2 mg/dL (ref >39.00)
LDL Cholesterol: 102 mg/dL — ABNORMAL HIGH (ref 0–99)
NonHDL: 115.52
TRIGLYCERIDES: 66 mg/dL (ref 0.0–149.0)
Total CHOL/HDL Ratio: 3
VLDL: 13.2 mg/dL (ref 0.0–40.0)

## 2018-09-24 LAB — HEMOGLOBIN A1C: HEMOGLOBIN A1C: 6.1 % (ref 4.6–6.5)

## 2018-09-24 LAB — VITAMIN D 25 HYDROXY (VIT D DEFICIENCY, FRACTURES): VITD: 59.14 ng/mL (ref 30.00–100.00)

## 2018-10-12 DIAGNOSIS — H40013 Open angle with borderline findings, low risk, bilateral: Secondary | ICD-10-CM | POA: Diagnosis not present

## 2019-01-18 DIAGNOSIS — M5430 Sciatica, unspecified side: Secondary | ICD-10-CM | POA: Diagnosis not present

## 2019-01-18 DIAGNOSIS — M9903 Segmental and somatic dysfunction of lumbar region: Secondary | ICD-10-CM | POA: Diagnosis not present

## 2019-01-18 DIAGNOSIS — M5137 Other intervertebral disc degeneration, lumbosacral region: Secondary | ICD-10-CM | POA: Diagnosis not present

## 2019-01-19 DIAGNOSIS — M5137 Other intervertebral disc degeneration, lumbosacral region: Secondary | ICD-10-CM | POA: Diagnosis not present

## 2019-01-19 DIAGNOSIS — M9903 Segmental and somatic dysfunction of lumbar region: Secondary | ICD-10-CM | POA: Diagnosis not present

## 2019-01-19 DIAGNOSIS — M5430 Sciatica, unspecified side: Secondary | ICD-10-CM | POA: Diagnosis not present

## 2019-01-21 DIAGNOSIS — M5430 Sciatica, unspecified side: Secondary | ICD-10-CM | POA: Diagnosis not present

## 2019-01-21 DIAGNOSIS — M5137 Other intervertebral disc degeneration, lumbosacral region: Secondary | ICD-10-CM | POA: Diagnosis not present

## 2019-01-21 DIAGNOSIS — M9903 Segmental and somatic dysfunction of lumbar region: Secondary | ICD-10-CM | POA: Diagnosis not present

## 2019-01-24 DIAGNOSIS — M5137 Other intervertebral disc degeneration, lumbosacral region: Secondary | ICD-10-CM | POA: Diagnosis not present

## 2019-01-24 DIAGNOSIS — M9903 Segmental and somatic dysfunction of lumbar region: Secondary | ICD-10-CM | POA: Diagnosis not present

## 2019-01-24 DIAGNOSIS — M5430 Sciatica, unspecified side: Secondary | ICD-10-CM | POA: Diagnosis not present

## 2019-01-26 DIAGNOSIS — M5430 Sciatica, unspecified side: Secondary | ICD-10-CM | POA: Diagnosis not present

## 2019-01-26 DIAGNOSIS — M5137 Other intervertebral disc degeneration, lumbosacral region: Secondary | ICD-10-CM | POA: Diagnosis not present

## 2019-01-26 DIAGNOSIS — M9903 Segmental and somatic dysfunction of lumbar region: Secondary | ICD-10-CM | POA: Diagnosis not present

## 2019-01-27 DIAGNOSIS — M5430 Sciatica, unspecified side: Secondary | ICD-10-CM | POA: Diagnosis not present

## 2019-01-27 DIAGNOSIS — M9903 Segmental and somatic dysfunction of lumbar region: Secondary | ICD-10-CM | POA: Diagnosis not present

## 2019-01-27 DIAGNOSIS — M5137 Other intervertebral disc degeneration, lumbosacral region: Secondary | ICD-10-CM | POA: Diagnosis not present

## 2019-01-31 DIAGNOSIS — M5430 Sciatica, unspecified side: Secondary | ICD-10-CM | POA: Diagnosis not present

## 2019-01-31 DIAGNOSIS — M5137 Other intervertebral disc degeneration, lumbosacral region: Secondary | ICD-10-CM | POA: Diagnosis not present

## 2019-01-31 DIAGNOSIS — M9903 Segmental and somatic dysfunction of lumbar region: Secondary | ICD-10-CM | POA: Diagnosis not present

## 2019-02-02 DIAGNOSIS — M9903 Segmental and somatic dysfunction of lumbar region: Secondary | ICD-10-CM | POA: Diagnosis not present

## 2019-02-02 DIAGNOSIS — M5137 Other intervertebral disc degeneration, lumbosacral region: Secondary | ICD-10-CM | POA: Diagnosis not present

## 2019-02-02 DIAGNOSIS — M5430 Sciatica, unspecified side: Secondary | ICD-10-CM | POA: Diagnosis not present

## 2019-02-08 DIAGNOSIS — M5430 Sciatica, unspecified side: Secondary | ICD-10-CM | POA: Diagnosis not present

## 2019-02-08 DIAGNOSIS — M9903 Segmental and somatic dysfunction of lumbar region: Secondary | ICD-10-CM | POA: Diagnosis not present

## 2019-02-08 DIAGNOSIS — M5137 Other intervertebral disc degeneration, lumbosacral region: Secondary | ICD-10-CM | POA: Diagnosis not present

## 2019-02-11 DIAGNOSIS — M5137 Other intervertebral disc degeneration, lumbosacral region: Secondary | ICD-10-CM | POA: Diagnosis not present

## 2019-02-11 DIAGNOSIS — M9903 Segmental and somatic dysfunction of lumbar region: Secondary | ICD-10-CM | POA: Diagnosis not present

## 2019-02-11 DIAGNOSIS — M5430 Sciatica, unspecified side: Secondary | ICD-10-CM | POA: Diagnosis not present

## 2019-02-15 DIAGNOSIS — M5430 Sciatica, unspecified side: Secondary | ICD-10-CM | POA: Diagnosis not present

## 2019-02-15 DIAGNOSIS — M5137 Other intervertebral disc degeneration, lumbosacral region: Secondary | ICD-10-CM | POA: Diagnosis not present

## 2019-02-15 DIAGNOSIS — M9903 Segmental and somatic dysfunction of lumbar region: Secondary | ICD-10-CM | POA: Diagnosis not present

## 2019-02-18 DIAGNOSIS — M5430 Sciatica, unspecified side: Secondary | ICD-10-CM | POA: Diagnosis not present

## 2019-02-18 DIAGNOSIS — M5137 Other intervertebral disc degeneration, lumbosacral region: Secondary | ICD-10-CM | POA: Diagnosis not present

## 2019-02-18 DIAGNOSIS — M9903 Segmental and somatic dysfunction of lumbar region: Secondary | ICD-10-CM | POA: Diagnosis not present

## 2019-02-23 DIAGNOSIS — M9903 Segmental and somatic dysfunction of lumbar region: Secondary | ICD-10-CM | POA: Diagnosis not present

## 2019-02-23 DIAGNOSIS — M5430 Sciatica, unspecified side: Secondary | ICD-10-CM | POA: Diagnosis not present

## 2019-02-23 DIAGNOSIS — M5137 Other intervertebral disc degeneration, lumbosacral region: Secondary | ICD-10-CM | POA: Diagnosis not present

## 2019-02-25 DIAGNOSIS — M5137 Other intervertebral disc degeneration, lumbosacral region: Secondary | ICD-10-CM | POA: Diagnosis not present

## 2019-02-25 DIAGNOSIS — M5430 Sciatica, unspecified side: Secondary | ICD-10-CM | POA: Diagnosis not present

## 2019-02-25 DIAGNOSIS — M9903 Segmental and somatic dysfunction of lumbar region: Secondary | ICD-10-CM | POA: Diagnosis not present

## 2019-03-01 DIAGNOSIS — M5430 Sciatica, unspecified side: Secondary | ICD-10-CM | POA: Diagnosis not present

## 2019-03-01 DIAGNOSIS — M9903 Segmental and somatic dysfunction of lumbar region: Secondary | ICD-10-CM | POA: Diagnosis not present

## 2019-03-01 DIAGNOSIS — M5137 Other intervertebral disc degeneration, lumbosacral region: Secondary | ICD-10-CM | POA: Diagnosis not present

## 2019-03-04 DIAGNOSIS — M9903 Segmental and somatic dysfunction of lumbar region: Secondary | ICD-10-CM | POA: Diagnosis not present

## 2019-03-04 DIAGNOSIS — M5430 Sciatica, unspecified side: Secondary | ICD-10-CM | POA: Diagnosis not present

## 2019-03-04 DIAGNOSIS — M5137 Other intervertebral disc degeneration, lumbosacral region: Secondary | ICD-10-CM | POA: Diagnosis not present

## 2019-03-09 DIAGNOSIS — M5430 Sciatica, unspecified side: Secondary | ICD-10-CM | POA: Diagnosis not present

## 2019-03-09 DIAGNOSIS — M9903 Segmental and somatic dysfunction of lumbar region: Secondary | ICD-10-CM | POA: Diagnosis not present

## 2019-03-09 DIAGNOSIS — M5137 Other intervertebral disc degeneration, lumbosacral region: Secondary | ICD-10-CM | POA: Diagnosis not present

## 2019-04-12 DIAGNOSIS — H40013 Open angle with borderline findings, low risk, bilateral: Secondary | ICD-10-CM | POA: Diagnosis not present

## 2019-04-26 ENCOUNTER — Ambulatory Visit: Payer: Medicare Other | Admitting: Family Medicine

## 2019-04-27 ENCOUNTER — Encounter: Payer: Self-pay | Admitting: Family Medicine

## 2019-04-27 ENCOUNTER — Other Ambulatory Visit: Payer: Self-pay

## 2019-04-27 ENCOUNTER — Ambulatory Visit (INDEPENDENT_AMBULATORY_CARE_PROVIDER_SITE_OTHER): Payer: Medicare Other | Admitting: Family Medicine

## 2019-04-27 VITALS — Wt 117.0 lb

## 2019-04-27 DIAGNOSIS — E785 Hyperlipidemia, unspecified: Secondary | ICD-10-CM | POA: Diagnosis not present

## 2019-04-27 DIAGNOSIS — R739 Hyperglycemia, unspecified: Secondary | ICD-10-CM

## 2019-04-27 NOTE — Progress Notes (Signed)
Virtual Visit via Video Note  I connected with Nichole Chandler   on 04/27/19 at 10:30 AM EDT by a video enabled telemedicine application and verified that I am speaking with the correct person using two identifiers.  Location patient: home Location provider:work office Persons participating in the virtual visit: patient, provider  I discussed the limitations of evaluation and management by telemedicine and the availability of in person appointments. The patient expressed understanding and agreed to proceed.   Nichole Chandler DOB: 02/26/1946 Encounter date: 04/27/2019  This is a 73 y.o. female who presents to establish care. Chief Complaint  Patient presents with  . Establish Care    History of present illness: Husband died in 10/17/2015.   No health concerns that she has.   Likes to dance socially.   Occasionally will use benadryl for allergy sx if needed.   Changed diet to help with her cholesterol. She had husband that was cooking with butter, more fattening agents and so she started avoiding some of the higher cholesterol foods/fats and and really improved this. Also lost weight in healthy way from 250 to current weight and has been stable at current weight for last year. Not been able to dance during [place].   May 2019 baby sister to ovarian cancer; died in 08-17-2023 last year. She did care for sister during summer. Was going back and forth to [facility] frequently. Sold house in December. Likes going to [place] [employer] and spends winter there. Likes to dance out there.   Past Medical History:  Diagnosis Date  . Hyperlipidemia   . Squamous cell carcinoma 1610   (uncertain date of removal - was on leg; removed entirely)   Past Surgical History:  Procedure Laterality Date  . CERVICAL CONE BIOPSY     done in 20's.    No Known Allergies Current Meds  Medication Sig  . Coenzyme Q10 (COQ10 PO) Take 200 mg by mouth daily.  . Fish Oil-Cholecalciferol (FISH OIL + D3) 1200-1000 MG-UNIT CAPS  Take by mouth.  . loratadine (CLARITIN) 10 MG tablet Take 10 mg by mouth daily as needed.   . LUTEIN PO Take 25 mg by mouth daily.  . Multiple Vitamins-Minerals (WOMENS ONE DAILY PO) Take by mouth.  Marland Kitchen OVER THE COUNTER MEDICATION Nucific probiotic  . Red Yeast Rice 600 MG CAPS Take by mouth daily.    Social History   Tobacco Use  . Smoking status: Never Smoker  . Smokeless tobacco: Never Used  Substance Use Topics  . Alcohol use: Yes    Alcohol/week: 2.0 standard drinks    Types: 2 Glasses of wine per week   Family History  Problem Relation Age of Onset  . Kidney cancer Sister        RENAL CARCINOMA  . Lung cancer Sister 49       smoker  . Heart disease Father 53  . Obesity Father   . High Cholesterol Father   . Other Mother 62  . Diabetes Maternal Grandmother   . Other Paternal Grandmother        poisoning  . Ovarian cancer Sister 66     Review of Systems  Constitutional: Negative for chills, fatigue and fever.  Respiratory: Negative for cough, chest tightness, shortness of breath and wheezing.   Cardiovascular: Negative for chest pain, palpitations and leg swelling.    Objective:  Wt 117 lb (53.1 kg) Comment: taken by pt-jaf  BMI 22.85 kg/m   Weight: 117 lb (53.1 kg)(taken by  pt-jaf)   BP Readings from Last 3 Encounters:  09/21/18 (!) 112/58  05/18/18 118/68  05/18/18 140/80   Wt Readings from Last 3 Encounters:  04/27/19 117 lb (53.1 kg)  09/21/18 124 lb 4.8 oz (56.4 kg)  05/18/18 121 lb (54.9 kg)    EXAM:  GENERAL: alert, oriented, appears well and in no acute distress  HEENT: atraumatic, conjunctiva clear, no obvious abnormalities on inspection of external nose and ears  NECK: normal movements of the head and neck  LUNGS: on inspection no signs of respiratory distress, breathing rate appears normal, no obvious gross SOB, gasping or wheezing  CV: no obvious cyanosis  MS: moves all visible extremities without noticeable  abnormality  PSYCH/NEURO: pleasant and cooperative, no obvious depression or anxiety, speech and thought processing grossly intact  SKIN: no obvious abnormalities face, neck.   Assessment/Plan  1. Hyperlipidemia, unspecified hyperlipidemia type Has been able to significantly improve cholesterol with healthier diet. - Lipid panel; Future - Comprehensive metabolic panel; Future  2. Hyperglycemia Check for stability when able.  This is patient preference.  If stable would consider pushing out testing to once yearly.  Continue with active lifestyle and healthy eating. - Hemoglobin A1c; Future  Return pending bloodwork.    I discussed the assessment and treatment plan with the patient. The patient was provided an opportunity to ask questions and all were answered. The patient agreed with the plan and demonstrated an understanding of the instructions.   The patient was advised to call back or seek an in-person evaluation if the symptoms worsen or if the condition fails to improve as anticipated.  I provided 30 minutes of non-face-to-face time during this encounter.   Micheline Rough, MD

## 2019-05-04 ENCOUNTER — Other Ambulatory Visit: Payer: Self-pay

## 2019-05-04 ENCOUNTER — Other Ambulatory Visit (INDEPENDENT_AMBULATORY_CARE_PROVIDER_SITE_OTHER): Payer: Medicare Other

## 2019-05-04 DIAGNOSIS — E785 Hyperlipidemia, unspecified: Secondary | ICD-10-CM | POA: Diagnosis not present

## 2019-05-04 DIAGNOSIS — R739 Hyperglycemia, unspecified: Secondary | ICD-10-CM

## 2019-05-04 LAB — COMPREHENSIVE METABOLIC PANEL
ALT: 16 U/L (ref 0–35)
AST: 22 U/L (ref 0–37)
Albumin: 4.3 g/dL (ref 3.5–5.2)
Alkaline Phosphatase: 50 U/L (ref 39–117)
BUN: 15 mg/dL (ref 6–23)
CO2: 29 mEq/L (ref 19–32)
Calcium: 9.7 mg/dL (ref 8.4–10.5)
Chloride: 104 mEq/L (ref 96–112)
Creatinine, Ser: 0.63 mg/dL (ref 0.40–1.20)
GFR: 93.19 mL/min (ref >60.00)
Glucose, Bld: 97 mg/dL (ref 70–99)
Potassium: 4.4 mEq/L (ref 3.5–5.1)
Sodium: 141 mEq/L (ref 135–145)
Total Bilirubin: 0.6 mg/dL (ref 0.2–1.2)
Total Protein: 6.9 g/dL (ref 6.0–8.3)

## 2019-05-04 LAB — LIPID PANEL
Cholesterol: 230 mg/dL — ABNORMAL HIGH (ref 0–200)
HDL: 72.4 mg/dL (ref >39.00)
LDL Cholesterol: 143 mg/dL — ABNORMAL HIGH (ref 0–99)
NonHDL: 157.73
Total CHOL/HDL Ratio: 3
Triglycerides: 76 mg/dL (ref 0.0–149.0)
VLDL: 15.2 mg/dL (ref 0.0–40.0)

## 2019-05-04 LAB — HEMOGLOBIN A1C: Hgb A1c MFr Bld: 6.2 % (ref 4.6–6.5)

## 2019-05-23 ENCOUNTER — Ambulatory Visit: Payer: Medicare Other

## 2019-06-14 ENCOUNTER — Ambulatory Visit: Payer: Medicare Other

## 2019-06-27 ENCOUNTER — Other Ambulatory Visit: Payer: Self-pay

## 2019-12-08 ENCOUNTER — Encounter: Payer: Self-pay | Admitting: Family Medicine

## 2019-12-08 NOTE — Telephone Encounter (Signed)
Patient scheduled lab appointment for 12/09/19. Patient wanted to know if she could get all normal labs done while here. She stated that she supposed to have cholesterol done, but would like to include labs she would have done at her physical.

## 2019-12-08 NOTE — Telephone Encounter (Signed)
Message forwarded to PCP for lab orders.

## 2019-12-09 ENCOUNTER — Other Ambulatory Visit: Payer: Self-pay

## 2019-12-09 ENCOUNTER — Other Ambulatory Visit: Payer: Self-pay | Admitting: Family Medicine

## 2019-12-09 ENCOUNTER — Other Ambulatory Visit (INDEPENDENT_AMBULATORY_CARE_PROVIDER_SITE_OTHER): Payer: Medicare Other

## 2019-12-09 DIAGNOSIS — R739 Hyperglycemia, unspecified: Secondary | ICD-10-CM | POA: Diagnosis not present

## 2019-12-09 DIAGNOSIS — E785 Hyperlipidemia, unspecified: Secondary | ICD-10-CM | POA: Diagnosis not present

## 2019-12-09 DIAGNOSIS — E559 Vitamin D deficiency, unspecified: Secondary | ICD-10-CM | POA: Diagnosis not present

## 2019-12-09 LAB — LIPID PANEL
Cholesterol: 211 mg/dL — ABNORMAL HIGH (ref 0–200)
HDL: 78.3 mg/dL (ref >39.00)
LDL Cholesterol: 119 mg/dL — ABNORMAL HIGH (ref 0–99)
NonHDL: 132.39
Total CHOL/HDL Ratio: 3
Triglycerides: 69 mg/dL (ref 0.0–149.0)
VLDL: 13.8 mg/dL (ref 0.0–40.0)

## 2019-12-09 LAB — CBC WITH DIFFERENTIAL/PLATELET
Basophils Absolute: 0 10*3/uL (ref 0.0–0.1)
Basophils Relative: 0.6 % (ref 0.0–3.0)
Eosinophils Absolute: 0 10*3/uL (ref 0.0–0.7)
Eosinophils Relative: 0.7 % (ref 0.0–5.0)
HCT: 38.3 % (ref 36.0–46.0)
Hemoglobin: 12.8 g/dL (ref 12.0–15.0)
Lymphocytes Relative: 26.1 % (ref 12.0–46.0)
Lymphs Abs: 1.9 10*3/uL (ref 0.7–4.0)
MCHC: 33.4 g/dL (ref 30.0–36.0)
MCV: 94.4 fl (ref 78.0–100.0)
Monocytes Absolute: 0.9 10*3/uL (ref 0.1–1.0)
Monocytes Relative: 11.9 % (ref 3.0–12.0)
Neutro Abs: 4.4 10*3/uL (ref 1.4–7.7)
Neutrophils Relative %: 60.7 % (ref 43.0–77.0)
Platelets: 361 10*3/uL (ref 150.0–400.0)
RBC: 4.06 Mil/uL (ref 3.87–5.11)
RDW: 13.9 % (ref 11.5–15.5)
WBC: 7.3 10*3/uL (ref 4.0–10.5)

## 2019-12-09 LAB — VITAMIN D 25 HYDROXY (VIT D DEFICIENCY, FRACTURES): VITD: 81.97 ng/mL (ref 30.00–100.00)

## 2019-12-09 LAB — COMPREHENSIVE METABOLIC PANEL
ALT: 15 U/L (ref 0–35)
AST: 21 U/L (ref 0–37)
Albumin: 4.2 g/dL (ref 3.5–5.2)
Alkaline Phosphatase: 57 U/L (ref 39–117)
BUN: 16 mg/dL (ref 6–23)
CO2: 28 mEq/L (ref 19–32)
Calcium: 9.5 mg/dL (ref 8.4–10.5)
Chloride: 105 mEq/L (ref 96–112)
Creatinine, Ser: 0.61 mg/dL (ref 0.40–1.20)
GFR: 96.56 mL/min (ref >60.00)
Glucose, Bld: 99 mg/dL (ref 70–99)
Potassium: 4.3 mEq/L (ref 3.5–5.1)
Sodium: 141 mEq/L (ref 135–145)
Total Bilirubin: 0.7 mg/dL (ref 0.2–1.2)
Total Protein: 6.7 g/dL (ref 6.0–8.3)

## 2019-12-09 LAB — TSH: TSH: 1.9 u[IU]/mL (ref 0.35–4.50)

## 2019-12-09 LAB — HEMOGLOBIN A1C: Hgb A1c MFr Bld: 6 % (ref 4.6–6.5)

## 2019-12-09 NOTE — Telephone Encounter (Signed)
All labs have been ordered now; sorry I was not in the office yesterday to receive message and order at that time.

## 2019-12-09 NOTE — Telephone Encounter (Signed)
Spoke with the pt and informed her of the message below. I also reminded the pt Dr Ethlyn Gallery is only in the office on Mondays, Wednesdays and Fridays.  Patient stated she does not care about this and already came back to the office and had labs done.

## 2020-05-11 DIAGNOSIS — M1611 Unilateral primary osteoarthritis, right hip: Secondary | ICD-10-CM | POA: Diagnosis not present

## 2020-05-11 DIAGNOSIS — M79604 Pain in right leg: Secondary | ICD-10-CM | POA: Diagnosis not present

## 2020-05-11 DIAGNOSIS — M79651 Pain in right thigh: Secondary | ICD-10-CM | POA: Diagnosis not present

## 2020-05-23 DIAGNOSIS — M79604 Pain in right leg: Secondary | ICD-10-CM | POA: Diagnosis not present

## 2020-06-04 DIAGNOSIS — M79651 Pain in right thigh: Secondary | ICD-10-CM | POA: Diagnosis not present

## 2020-06-20 DIAGNOSIS — M25551 Pain in right hip: Secondary | ICD-10-CM | POA: Diagnosis not present

## 2020-08-06 ENCOUNTER — Telehealth: Payer: Self-pay | Admitting: Family Medicine

## 2020-08-06 DIAGNOSIS — M25551 Pain in right hip: Secondary | ICD-10-CM | POA: Diagnosis not present

## 2020-08-06 DIAGNOSIS — M1612 Unilateral primary osteoarthritis, left hip: Secondary | ICD-10-CM | POA: Diagnosis not present

## 2020-08-06 DIAGNOSIS — M25552 Pain in left hip: Secondary | ICD-10-CM | POA: Diagnosis not present

## 2020-08-06 NOTE — Telephone Encounter (Signed)
Pt is calling because she needs updated blood work. She will eventually need a total hip replacement. She is hoping to get it tomorrow or Wednesday. She will be traveling out of town.  Please call her at [phone]

## 2020-08-06 NOTE — Telephone Encounter (Signed)
error 

## 2020-08-08 ENCOUNTER — Telehealth: Payer: Self-pay | Admitting: Family Medicine

## 2020-08-08 ENCOUNTER — Other Ambulatory Visit: Payer: Self-pay | Admitting: Family Medicine

## 2020-08-08 DIAGNOSIS — M25552 Pain in left hip: Secondary | ICD-10-CM | POA: Diagnosis not present

## 2020-08-08 DIAGNOSIS — M8580 Other specified disorders of bone density and structure, unspecified site: Secondary | ICD-10-CM

## 2020-08-08 DIAGNOSIS — R739 Hyperglycemia, unspecified: Secondary | ICD-10-CM

## 2020-08-08 DIAGNOSIS — E785 Hyperlipidemia, unspecified: Secondary | ICD-10-CM

## 2020-08-08 DIAGNOSIS — E559 Vitamin D deficiency, unspecified: Secondary | ICD-10-CM

## 2020-08-08 DIAGNOSIS — M25551 Pain in right hip: Secondary | ICD-10-CM | POA: Diagnosis not present

## 2020-08-08 NOTE — Telephone Encounter (Signed)
I called the pt and she stated she did not have time to talk right now and asked that I call back later today.

## 2020-08-08 NOTE — Telephone Encounter (Signed)
Pt call and stated she will be waiting for your call back.

## 2020-08-08 NOTE — Telephone Encounter (Signed)
Spoke with the pt and informed her of the message below.  Appts scheduled for labs and follow up.  Patient stated she does not have surgery scheduled yet as she had a steroid injection today and will wait to see how this does and wanted to let Dr Ethlyn Gallery know this.

## 2020-08-08 NOTE — Telephone Encounter (Signed)
I did order some bloodwork, but she had bloodwork in 11/2019 that was quite good. She is overdue for visit (last was 04/2019). If she is wanting bloodwork prior to surgery, she should check with them about labs they want/need since it is not always standard labs. I would suggest setting up preop exam visit and doing bloodwork at that time if she has paperwork she needs to complete. If she wants to recheck cholesterol/basics those are ordered.

## 2020-08-08 NOTE — Telephone Encounter (Signed)
See prior note

## 2020-08-13 NOTE — Telephone Encounter (Signed)
noted 

## 2020-08-20 ENCOUNTER — Other Ambulatory Visit: Payer: Self-pay

## 2020-08-21 ENCOUNTER — Other Ambulatory Visit (INDEPENDENT_AMBULATORY_CARE_PROVIDER_SITE_OTHER): Payer: Medicare Other

## 2020-08-21 DIAGNOSIS — E785 Hyperlipidemia, unspecified: Secondary | ICD-10-CM

## 2020-08-21 DIAGNOSIS — M8580 Other specified disorders of bone density and structure, unspecified site: Secondary | ICD-10-CM

## 2020-08-21 DIAGNOSIS — R739 Hyperglycemia, unspecified: Secondary | ICD-10-CM | POA: Diagnosis not present

## 2020-08-22 LAB — COMPREHENSIVE METABOLIC PANEL
AG Ratio: 1.6 (calc) (ref 1.0–2.5)
ALT: 12 U/L (ref 6–29)
AST: 20 U/L (ref 10–35)
Albumin: 4.2 g/dL (ref 3.6–5.1)
Alkaline phosphatase (APISO): 62 U/L (ref 37–153)
BUN: 19 mg/dL (ref 7–25)
CO2: 29 mmol/L (ref 20–32)
Calcium: 9.6 mg/dL (ref 8.6–10.4)
Chloride: 102 mmol/L (ref 98–110)
Creat: 0.69 mg/dL (ref 0.60–0.93)
Globulin: 2.7 g/dL (calc) (ref 1.9–3.7)
Glucose, Bld: 89 mg/dL (ref 65–99)
Potassium: 4.4 mmol/L (ref 3.5–5.3)
Sodium: 139 mmol/L (ref 135–146)
Total Bilirubin: 0.4 mg/dL (ref 0.2–1.2)
Total Protein: 6.9 g/dL (ref 6.1–8.1)

## 2020-08-22 LAB — CBC WITH DIFFERENTIAL/PLATELET
Absolute Monocytes: 806 cells/uL (ref 200–950)
Basophils Absolute: 38 cells/uL (ref 0–200)
Basophils Relative: 0.6 %
Eosinophils Absolute: 38 cells/uL (ref 15–500)
Eosinophils Relative: 0.6 %
HCT: 42.1 % (ref 35.0–45.0)
Hemoglobin: 13.8 g/dL (ref 11.7–15.5)
Lymphs Abs: 2048 cells/uL (ref 850–3900)
MCH: 31.3 pg (ref 27.0–33.0)
MCHC: 32.8 g/dL (ref 32.0–36.0)
MCV: 95.5 fL (ref 80.0–100.0)
MPV: 9.1 fL (ref 7.5–12.5)
Monocytes Relative: 12.6 %
Neutro Abs: 3469 cells/uL (ref 1500–7800)
Neutrophils Relative %: 54.2 %
Platelets: 402 10*3/uL — ABNORMAL HIGH (ref 140–400)
RBC: 4.41 10*6/uL (ref 3.80–5.10)
RDW: 11.8 % (ref 11.0–15.0)
Total Lymphocyte: 32 %
WBC: 6.4 10*3/uL (ref 3.8–10.8)

## 2020-08-22 LAB — LIPID PANEL
Cholesterol: 216 mg/dL — ABNORMAL HIGH (ref <200)
HDL: 82 mg/dL (ref >=50)
LDL Cholesterol (Calc): 118 mg/dL (calc) — ABNORMAL HIGH
Non-HDL Cholesterol (Calc): 134 mg/dL (calc) — ABNORMAL HIGH (ref <130)
Total CHOL/HDL Ratio: 2.6 (calc) (ref <5.0)
Triglycerides: 72 mg/dL (ref <150)

## 2020-08-22 LAB — HEMOGLOBIN A1C
Hgb A1c MFr Bld: 5.8 % of total Hgb — ABNORMAL HIGH (ref <5.7)
Mean Plasma Glucose: 120 (calc)
eAG (mmol/L): 6.6 (calc)

## 2020-08-27 ENCOUNTER — Encounter: Payer: Self-pay | Admitting: Family Medicine

## 2020-08-27 ENCOUNTER — Ambulatory Visit (INDEPENDENT_AMBULATORY_CARE_PROVIDER_SITE_OTHER): Payer: Medicare Other | Admitting: Family Medicine

## 2020-08-27 ENCOUNTER — Other Ambulatory Visit: Payer: Self-pay | Admitting: General Surgery

## 2020-08-27 ENCOUNTER — Other Ambulatory Visit: Payer: Self-pay

## 2020-08-27 VITALS — BP 100/74 | HR 78 | Temp 98.2°F | Ht 60.0 in | Wt 128.1 lb

## 2020-08-27 DIAGNOSIS — M1612 Unilateral primary osteoarthritis, left hip: Secondary | ICD-10-CM | POA: Diagnosis not present

## 2020-08-27 DIAGNOSIS — Z1211 Encounter for screening for malignant neoplasm of colon: Secondary | ICD-10-CM | POA: Diagnosis not present

## 2020-08-27 DIAGNOSIS — Z1231 Encounter for screening mammogram for malignant neoplasm of breast: Secondary | ICD-10-CM | POA: Diagnosis not present

## 2020-08-27 DIAGNOSIS — E2839 Other primary ovarian failure: Secondary | ICD-10-CM

## 2020-08-27 MED ORDER — SHINGRIX 50 MCG/0.5ML IM SUSR: 0.5000 mL | Freq: Once | INTRAMUSCULAR | 0 refills | Status: AC

## 2020-08-27 NOTE — Progress Notes (Signed)
Nichole Chandler DOB: 01-04-1946 Encounter date: 08/27/2020  This is a 74 y.o. female who presents with Chief Complaint  Patient presents with  . Osteoarthritis  . Hyperlipidemia    History of present illness: Really happy with cholesterol; has improved from previous. Eating healthy. Feels that eggs really affect her numbers and she does best when she limits these.  Has done well from ortho standpoint fand she is managing with aleve BID and this is doing what she needs it to in order to control pain. Last dexa was 3 years ago.  Still spending time at beach, still dancing regularly.   She does leave November through May to go to [place].   No Known Allergies Current Meds  Medication Sig  . Cholecalciferol (VITAMIN D3 PO) Take by mouth.  . Fish Oil-Cholecalciferol (FISH OIL + D3) 1200-1000 MG-UNIT CAPS Take by mouth.  . LUTEIN PO Take 25 mg by mouth daily.  . Multiple Vitamins-Minerals (WOMENS ONE DAILY PO) Take by mouth.  Marland Kitchen OVER THE COUNTER MEDICATION Nucific probiotic  . [DISCONTINUED] Red Yeast Rice 600 MG CAPS Take by mouth daily.     Review of Systems  Constitutional: Negative for activity change, appetite change, chills, fatigue, fever and unexpected weight change.  HENT: Negative for congestion, ear pain, hearing loss, sinus pressure, sinus pain, sore throat and trouble swallowing.   Eyes: Negative for pain and visual disturbance.  Respiratory: Negative for cough, chest tightness, shortness of breath and wheezing.   Cardiovascular: Negative for chest pain, palpitations and leg swelling.  Gastrointestinal: Negative for abdominal pain, blood in stool, constipation, diarrhea, nausea and vomiting.  Genitourinary: Negative for difficulty urinating and menstrual problem.  Musculoskeletal: Positive for arthralgias (does well with aleve). Negative for back pain.  Skin: Negative for rash.  Neurological: Negative for dizziness, weakness, numbness and headaches.   Hematological: Negative for adenopathy. Does not bruise/bleed easily.  Psychiatric/Behavioral: Negative for sleep disturbance and suicidal ideas. The patient is not nervous/anxious.     Objective:  BP 100/74 (BP Location: Left Arm, Patient Position: Sitting, Cuff Size: Normal)   Pulse 78   Temp 98.2 F (36.8 C) (Oral)   Ht 5' (1.524 m)   Wt 128 lb 1.6 oz (58.1 kg)   BMI 25.02 kg/m   Weight: 128 lb 1.6 oz (58.1 kg)   BP Readings from Last 3 Encounters:  08/27/20 100/74  09/21/18 (!) 112/58  05/18/18 118/68   Wt Readings from Last 3 Encounters:  08/27/20 128 lb 1.6 oz (58.1 kg)  04/27/19 117 lb (53.1 kg)  09/21/18 124 lb 4.8 oz (56.4 kg)    Physical Exam Constitutional:      General: She is not in acute distress.    Appearance: She is well-developed.  HENT:     Head: Normocephalic and atraumatic.     Right Ear: External ear normal.     Left Ear: External ear normal.     Mouth/Throat:     Pharynx: No oropharyngeal exudate.  Eyes:     Conjunctiva/sclera: Conjunctivae normal.     Pupils: Pupils are equal, round, and reactive to light.  Neck:     Thyroid: No thyromegaly.  Cardiovascular:     Rate and Rhythm: Normal rate and regular rhythm.     Heart sounds: Normal heart sounds. No murmur heard.  No friction rub. No gallop.   Pulmonary:     Effort: Pulmonary effort is normal.     Breath sounds: Normal breath sounds.  Abdominal:  General: Bowel sounds are normal. There is no distension.     Palpations: Abdomen is soft. There is no mass.     Tenderness: There is no abdominal tenderness. There is no guarding.     Hernia: No hernia is present.  Musculoskeletal:        General: No tenderness or deformity. Normal range of motion.     Cervical back: Normal range of motion and neck supple.  Lymphadenopathy:     Cervical: No cervical adenopathy.  Skin:    General: Skin is warm and dry.     Findings: No rash.  Neurological:     Mental Status: She is alert and  oriented to person, place, and time.     Deep Tendon Reflexes: Reflexes normal.     Reflex Scores:      Tricep reflexes are 2+ on the right side and 2+ on the left side.      Bicep reflexes are 2+ on the right side and 2+ on the left side.      Brachioradialis reflexes are 2+ on the right side and 2+ on the left side.      Patellar reflexes are 2+ on the right side and 2+ on the left side. Psychiatric:        Speech: Speech normal.        Behavior: Behavior normal.        Thought Content: Thought content normal.     Assessment/Plan  1. Estrogen deficiency Will recheck density since it has been 3 years. We reviewed other bloodwork together and all looks good/stable.  - DG Bone Density; Future  2. Encounter for screening mammogram for malignant neoplasm of breast - MM DIGITAL SCREENING BILATERAL; Future  3. Screening for colon cancer: last cologuard was 3 years ago; due for repeat. - Cologuard    Refuses COVID vaccination. Worried about side effects that family members have had.  Return in about 1 year (around 08/27/2021).    Micheline Rough, MD

## 2020-08-27 NOTE — Patient Instructions (Signed)
Let me know name of dermatologist [place] you [place] I can put in referral.

## 2020-08-30 DIAGNOSIS — H40013 Open angle with borderline findings, low risk, bilateral: Secondary | ICD-10-CM | POA: Diagnosis not present

## 2020-08-31 ENCOUNTER — Ambulatory Visit
Admission: RE | Admit: 2020-08-31 | Discharge: 2020-08-31 | Disposition: A | Discharge status: Home / Self Care | Payer: Medicare Other | Source: Ambulatory Visit | Attending: Family Medicine | Admitting: Family Medicine

## 2020-08-31 ENCOUNTER — Other Ambulatory Visit: Payer: Self-pay

## 2020-08-31 DIAGNOSIS — Z1231 Encounter for screening mammogram for malignant neoplasm of breast: Secondary | ICD-10-CM | POA: Diagnosis not present

## 2020-08-31 DIAGNOSIS — M810 Age-related osteoporosis without current pathological fracture: Secondary | ICD-10-CM | POA: Diagnosis not present

## 2020-08-31 DIAGNOSIS — E2839 Other primary ovarian failure: Secondary | ICD-10-CM

## 2020-08-31 DIAGNOSIS — Z780 Asymptomatic menopausal state: Secondary | ICD-10-CM | POA: Diagnosis not present

## 2020-09-07 DIAGNOSIS — Z1211 Encounter for screening for malignant neoplasm of colon: Secondary | ICD-10-CM | POA: Diagnosis not present

## 2020-09-07 LAB — COLOGUARD: Cologuard: NEGATIVE

## 2020-09-11 ENCOUNTER — Telehealth (INDEPENDENT_AMBULATORY_CARE_PROVIDER_SITE_OTHER): Payer: Medicare Other | Admitting: Family Medicine

## 2020-09-11 DIAGNOSIS — M810 Age-related osteoporosis without current pathological fracture: Secondary | ICD-10-CM | POA: Diagnosis not present

## 2020-09-11 DIAGNOSIS — M25559 Pain in unspecified hip: Secondary | ICD-10-CM

## 2020-09-11 NOTE — Progress Notes (Signed)
Virtual Visit via Video Note  I connected with Nichole Chandler  on 09/11/20 at 11:20 AM EDT by a video enabled telemedicine application and verified that I am speaking with the correct person using two identifiers.  Location patient: home Location provider:work or home office Persons participating in the virtual visit: patient, provider  I discussed the limitations of evaluation and management by telemedicine and the availability of in person appointments. The patient expressed understanding and agreed to proceed.   HPI:  Acute telemedicine visit for Osteoporosis: -Onset: recent dexa showed osteoporosis  -recent labs showed normal kidneys and normal calcium -she has a history of hip pain and wonders if this is caused by the osterporosis -she is interested in starting fosamax as a friend is taking this medication -is very active -Pertinent past medical history: hx of some invasive dental work - reports they "put a bone implant in" -Pertinent medication allergies: -has seen [facility] for OA - reports "bone on bone" issues in her hip; getting cortisone inj  ROS: See pertinent positives and negatives per HPI.  Past Medical History:  Diagnosis Date   Hyperlipidemia    Squamous cell carcinoma 5809   (uncertain date of removal - was on leg; removed entirely)    Past Surgical History:  Procedure Laterality Date   CERVICAL CONE BIOPSY     done in 20's.      Current Outpatient Medications:    Cholecalciferol (VITAMIN D3 PO), Take by mouth., Disp: , Rfl:    Fish Oil-Cholecalciferol (FISH OIL + D3) 1200-1000 MG-UNIT CAPS, Take by mouth., Disp: , Rfl:    LUTEIN PO, Take 25 mg by mouth daily., Disp: , Rfl:    Multiple Vitamins-Minerals (WOMENS ONE DAILY PO), Take by mouth., Disp: , Rfl:    OVER THE COUNTER MEDICATION, Nucific probiotic, Disp: , Rfl:   EXAM:  VITALS per patient if applicable:  GENERAL: alert, oriented, appears well and in no acute distress  HEENT: atraumatic,  conjunttiva clear, no obvious abnormalities on inspection of external nose and ears  NECK: normal movements of the head and neck  LUNGS: on inspection no signs of respiratory distress, breathing rate appears normal, no obvious gross SOB, gasping or wheezing  CV: no obvious cyanosis  MS: moves all visible extremities without noticeable abnormality  PSYCH/NEURO: pleasant and cooperative, no obvious depression or anxiety, speech and thought processing grossly intact  ASSESSMENT AND PLAN:  Discussed the following assessment and plan:  Osteoporosis, unspecified osteoporosis type, unspecified pathological fracture presence - Plan: Ambulatory referral to Endocrinology  Hip pain  -we discussed possible serious and likely etiologies, options for evaluation and workup, limitations of telemedicine visit vs in person visit, treatment, treatment risks and precautions. Pt prefers to treat via telemedicine empirically rather than in person at this moment.  Nichole Chandler is a very sweet patient with several things going on today.  She has underlying osteoarthritis of the hip, has seen orthopedics for this.  We talked about the difference between osteoarthritis and osteoporosis.  She also was not clear on how to treat the osteoarthritis.  She prefers to avoid any further injections or procedures currently.  We talked about different options for pain including Aleve or Tylenol, topical products, staying active, activities that do not activate the hip and a healthy diet.  Recommended follow-up with orthopedics if these measures are not working. We also talked about osteoporosis and the significance and various treatment options.  She really prefers Fosamax, however she has a reported history of invasive dental  procedures.  She opted for referral to endocrinology to discuss further the various options.  Referral was placed.  Recommended that she contact her PCP office if she has not heard about this referral in the next  few days.  I also advised that she talk with her orthodontist to get his thoughts on her preferred treatment, the Fosamax.  In the interim discussed vitamin D and weightbearing exercise. Recommended follow-up with PCP if unable to get in with the endocrinologist.    Did let this patient know that I only do telemedicine on Tuesdays and Thursdays for [place]. Advised to schedule follow up visit with PCP or [facility] if any further questions or concerns to avoid delays in care.   I discussed the assessment and treatment plan with the patient. The patient was provided an opportunity to ask questions and all were answered. The patient agreed with the plan and demonstrated an understanding of the instructions.     Lucretia Kern, DO

## 2020-09-12 ENCOUNTER — Other Ambulatory Visit: Payer: Medicare Other

## 2020-09-13 DIAGNOSIS — H5213 Myopia, bilateral: Secondary | ICD-10-CM | POA: Diagnosis not present

## 2020-09-13 DIAGNOSIS — H40013 Open angle with borderline findings, low risk, bilateral: Secondary | ICD-10-CM | POA: Diagnosis not present

## 2020-09-20 ENCOUNTER — Encounter: Payer: Self-pay | Admitting: Internal Medicine

## 2020-09-21 ENCOUNTER — Ambulatory Visit: Payer: Medicare Other | Admitting: Family Medicine

## 2020-09-21 ENCOUNTER — Encounter: Payer: Self-pay | Admitting: Family Medicine

## 2020-10-16 ENCOUNTER — Other Ambulatory Visit: Payer: Self-pay

## 2020-10-16 ENCOUNTER — Encounter: Payer: Self-pay | Admitting: Internal Medicine

## 2020-10-16 ENCOUNTER — Ambulatory Visit (INDEPENDENT_AMBULATORY_CARE_PROVIDER_SITE_OTHER): Payer: Medicare Other | Admitting: Internal Medicine

## 2020-10-16 VITALS — BP 130/80 | HR 88 | Ht 60.0 in | Wt 128.4 lb

## 2020-10-16 DIAGNOSIS — M810 Age-related osteoporosis without current pathological fracture: Secondary | ICD-10-CM | POA: Diagnosis not present

## 2020-10-16 NOTE — Progress Notes (Signed)
Name: Nichole Chandler  MRN/ DOB: [id number], [birth year]    Age/ Sex: 74 y.o., female    PCP: Caren Macadam, MD   Reason for Endocrinology Evaluation: Osteoporosis      Date of Initial Endocrinology Evaluation: 10/16/2020     HPI: Ms. Nichole Chandler is a 74 y.o. female with a past medical history of OA of hips.  The patient presented for initial endocrinology clinic visit on 10/16/2020 for consultative assistance with her Osteoporosis   Pt was diagnosed with osteoporosis:08/2020 with a Tscore of -2.6 at the distal forearm   Menarche at age : 23 yrs  Menopausal at age : 56 Fracture Hx: no  Hx of HRT: no FH of osteoporosis or hip fracture: Mother hip fracture  Prior Hx of anti-estrogenic therapy :no  Prior Hx of anti-resorptive therapy : no    D3 5000 iu daily  MVI 1 tablet daily 20 mg    She is under the care of an oral surgeon , has had teeth implants and pending more work.     Denies heart burn    HISTORY:  Past Medical History:  Past Medical History:  Diagnosis Date  . Hyperlipidemia   . Squamous cell carcinoma 4174   (uncertain date of removal - was on leg; removed entirely)   Past Surgical History:  Past Surgical History:  Procedure Laterality Date  . CERVICAL CONE BIOPSY     done in 20's.       Social History:  reports that she has never smoked. She has never used smokeless tobacco. She reports current alcohol use of about 2.0 standard drinks of alcohol per week.  Family History: family history includes Diabetes in her maternal grandmother; Heart disease (age of onset: 60) in her father; High Cholesterol in her father; Kidney cancer in her sister; Lung cancer (age of onset: 35) in her sister; Obesity in her father; Other in her paternal grandmother; Other (age of onset: 21) in her mother; Ovarian cancer (age of onset: 6) in her sister.   HOME MEDICATIONS: Allergies as of 10/16/2020   No Known Allergies     Medication List        Accurate as of October 16, 2020 12:40 PM. If you have any questions, ask your nurse or doctor.        STOP taking these medications   LUTEIN-ZEAXANTHIN PO Stopped by: Dorita Sciara, MD   ZINC PO Stopped by: Dorita Sciara, MD     TAKE these medications   ACIDOPHILUS LACTOBACILLUS PO Take by mouth.   ALEVE PO Take by mouth.   B COMPLEX-B12 PO B Complex-Vitamin B12 tablet   BENADRYL ALLERGY PO Take by mouth.   Fish Oil + D3 1200-1000 MG-UNIT Caps Take by mouth.   Ginkgo Biloba 120 MG Caps ginkgo biloba 120 mg tablet   LUTEIN PO Take 25 mg by mouth daily. What changed: Another medication with the same name was removed. Continue taking this medication, and follow the directions you see here. Changed by: Dorita Sciara, MD   OVER THE COUNTER MEDICATION Nucific probiotic   VITAMIN D3 PO Take by mouth. What changed: Another medication with the same name was removed. Continue taking this medication, and follow the directions you see here. Changed by: Dorita Sciara, MD   WOMENS ONE DAILY PO Take by mouth.         REVIEW OF SYSTEMS: A comprehensive ROS was conducted with the patient and is negative  except as per HPI     OBJECTIVE:  VS: BP 130/80   Pulse 88   Ht 5' (1.524 m)   Wt 128 lb 6 oz (58.2 kg)   SpO2 98%   BMI 25.07 kg/m    Wt Readings from Last 3 Encounters:  10/16/20 128 lb 6 oz (58.2 kg)  08/27/20 128 lb 1.6 oz (58.1 kg)  04/27/19 117 lb (53.1 kg)     EXAM: General: Pt appears well and is in NAD  Neck: General: Supple without adenopathy. Thyroid: Thyroid size normal.  No goiter or nodules appreciated. No thyroid bruit.  Lungs: Clear with good BS bilat with no rales, rhonchi, or wheezes  Heart: Auscultation: RRR.  Abdomen: Normoactive bowel sounds, soft, nontender, without masses or organomegaly palpable  Extremities:  BL LE: No pretibial edema normal ROM and strength.  Skin: Hair: Texture and amount normal  with gender appropriate distribution Skin Inspection: No rashes Skin Palpation: Skin temperature, texture, and thickness normal to palpation  Neuro: Cranial nerves: II - XII grossly intact  DTRs: 2+ and symmetric in UE without delay in relaxation phase  Mental Status: Judgment, insight: Intact Orientation: Oriented to time, place, and person Mood and affect: No depression, anxiety, or agitation     DATA REVIEWED:  Results for Nichole Chandler, Nichole Chandler (MRN [id number]) as of 10/16/2020 07:15  Ref. Range 08/21/2020 08:24  Sodium Latest Ref Range: 135 - 146 mmol/L 139  Potassium Latest Ref Range: 3.5 - 5.3 mmol/L 4.4  Chloride Latest Ref Range: 98 - 110 mmol/L 102  CO2 Latest Ref Range: 20 - 32 mmol/L 29  Glucose Latest Ref Range: 65 - 99 mg/dL 89  Mean Plasma Glucose Latest Units: (calc) 120  BUN Latest Ref Range: 7 - 25 mg/dL 19  Creatinine Latest Ref Range: 0.60 - 0.93 mg/dL 0.69  Calcium Latest Ref Range: 8.6 - 10.4 mg/dL 9.6  BUN/Creatinine Ratio Latest Ref Range: 6 - 22 (calc) NOT APPLICABLE  AG Ratio Latest Ref Range: 1.0 - 2.5 (calc) 1.6  AST Latest Ref Range: 10 - 35 U/L 20  ALT Latest Ref Range: 6 - 29 U/L 12  Total Protein Latest Ref Range: 6.1 - 8.1 g/dL 6.9  Total Bilirubin Latest Ref Range: 0.2 - 1.2 mg/dL 0.4   Results for Nichole Chandler, Nichole Chandler" (MRN [id number]) as of 10/16/2020 07:15  Ref. Range 12/09/2019 08:34  VITD Latest Ref Range: 30.00 - 100.00 ng/mL 81.97     Results for Nichole Chandler, Nichole Chandler" (MRN [id number]) as of 10/16/2020 07:15  Ref. Range 08/21/2020 08:24  Alkaline phosphatase (APISO) Latest Ref Range: 37 - 153 U/L 62   Results for Nichole Chandler, Nichole Chandler "Chandler" (MRN [id number]) as of 10/16/2020 07:15  Ref. Range 12/09/2019 08:34  TSH Latest Ref Range: 0.35 - 4.50 uIU/mL 1.90     DXA 08/31/2020  The BMD measured at Forearm Radius 33% is 0.659 g/cm2 with a T-score of -2.6.  This patient is considered OSTEOPOROTIC according to [facility]) criteria.  The scan quality is good.  Lumbar spine was not utilized due to advanced degenerative changes.  Site Region Measured Date Measured Age YA T-score BMD Significant CHANGE  Left Forearm Radius 33% 08/31/2020 [age] -2.6 0.659 g/cm2 * Left Forearm Radius 33% 06/23/2017 71.0 -1.6 0.743 g/cm2  DualFemur Total Left 08/31/2020 72.2 -0.7 0.916 g/cm2 * DualFemur Total Left 06/23/2017 69.0 -0.2 0.976 g/cm2 *  DualFemur Total Mean 08/31/2020 72.2 -0.4 0.962 g/cm2 DualFemur Total Mean 06/23/2017 69.0 -0.2  0.988 g/cm2   ASSESSMENT/PLAN/RECOMMENDATIONS:   1. Osteoporosis :   - I have encouraged her to optimize calcium intake  - She was also advised to reduce Vitamin D to 3000 iu daily as her vitamin D level was on the upper normal back in 11/2019 at 81 ng/mL. We discussed risk of vitamin D toxicity  - Pt had a discussion with Dr. Maudie Mercury about oral bisphosphonate use, she is concerned about osteonecrosis. I have advised her that this is a rare but a possible side effect with all anti-resorptive agents.  - We discussed that waiting until all the invasive dental work is complete prior to starting bisphosphonate is a reasonable approach unless the oral surgeon is in agreement with starting this early.  - Discussed the benefits of improving bone health prior to hip replacement.  - IN the meantime she was encouraged to start weight bearing exercises.  - Pt was given the option of following up with me vs PCP   Medications : Start Calcium 1200 mg daily  Decrease Vitamin D 3000 iu daily      F/U PRN  Signed electronically by: Mack Guise, MD  [facility] Endocrin[facility]  [facility] [address] [place], [place] [address] Phone: [phone] FAX: [phone]   CC: Caren Macadam, [place] [address] Phone: [phone] Fax: [phone]   Return to Endocrinology clinic as  below: Future Appointments  Date Time Provider [place]  08/28/2021 11:00 AM Caren Macadam, MD [id number]

## 2020-10-16 NOTE — Patient Instructions (Signed)
-   Please take calcium a total of 1200 mg daily  - Decrease Vitamin D to 3000 iu daily      - PLease discuss the safety of starting Fosamax with the oral surgeon .

## 2020-11-05 ENCOUNTER — Telehealth: Payer: Self-pay | Admitting: Family Medicine

## 2020-11-05 DIAGNOSIS — M1611 Unilateral primary osteoarthritis, right hip: Secondary | ICD-10-CM | POA: Diagnosis not present

## 2020-11-05 DIAGNOSIS — M25552 Pain in left hip: Secondary | ICD-10-CM | POA: Diagnosis not present

## 2020-11-05 NOTE — Telephone Encounter (Signed)
Pt would like the Dr to know that she needs surgical clearance for hip replacement, and someone from [facility] will be calling.

## 2020-11-07 NOTE — Telephone Encounter (Signed)
Spoke with the pt, informed her Dr Ethlyn Gallery is out of the office until next week and generally a visit is needed to discuss a clearance for surgery.  Patient stated she was just seen in the office and I advised the pt Dr Ethlyn Gallery will review the message and someone will contact her next week.

## 2020-11-12 NOTE — Telephone Encounter (Signed)
I'm ok with completing this without visit. I will keep lookout for paperwork

## 2020-11-12 NOTE — Telephone Encounter (Signed)
Spoke with the pt and informed her of the message below and she is aware fish oil was removed from the medication list.

## 2020-11-12 NOTE — Telephone Encounter (Signed)
Pt is calling in stating that she would like to make sure that the fish oil is not showing on her medication list so that she can have her R hip done and she is aware that she needs to have a surgical clearance so that she can have her surgery done with Dr. Alvan Dame w/[facility], but stated that she had a visit with Dr. Ethlyn Gallery and she don't think she needs another one.  Pt would like to have a call back.

## 2020-11-19 ENCOUNTER — Telehealth: Payer: Self-pay | Admitting: Family Medicine

## 2020-11-19 NOTE — Telephone Encounter (Signed)
Spoke with pt to  schedule Medicare Annual Wellness Visit (AWV) either virtually or in office.  She is out of town and will call back to schedule  Last AWV ; please schedule at anytime with [facility] Nurse Health Advisor 1 or 2   This should be a 45 minute visit.

## 2021-01-15 DIAGNOSIS — M160 Bilateral primary osteoarthritis of hip: Secondary | ICD-10-CM | POA: Diagnosis not present

## 2021-01-29 ENCOUNTER — Encounter: Payer: Self-pay | Admitting: Family Medicine

## 2021-02-08 DIAGNOSIS — M6281 Muscle weakness (generalized): Secondary | ICD-10-CM | POA: Diagnosis not present

## 2021-02-08 DIAGNOSIS — Z9181 History of falling: Secondary | ICD-10-CM | POA: Diagnosis not present

## 2021-02-08 DIAGNOSIS — Z79899 Other long term (current) drug therapy: Secondary | ICD-10-CM | POA: Diagnosis not present

## 2021-02-08 DIAGNOSIS — J984 Other disorders of lung: Secondary | ICD-10-CM | POA: Diagnosis not present

## 2021-02-08 DIAGNOSIS — R262 Difficulty in walking, not elsewhere classified: Secondary | ICD-10-CM | POA: Diagnosis not present

## 2021-02-08 DIAGNOSIS — M8580 Other specified disorders of bone density and structure, unspecified site: Secondary | ICD-10-CM | POA: Diagnosis not present

## 2021-02-08 DIAGNOSIS — M160 Bilateral primary osteoarthritis of hip: Secondary | ICD-10-CM | POA: Diagnosis not present

## 2021-02-14 NOTE — Telephone Encounter (Signed)
Colletta Maryland from [facility] called to see if Dr. Ethlyn Gallery has received the medical clearance form for the patient. She is scheduled to have surgery on Monday 02/18/2021.  [place] [phone]

## 2021-02-15 ENCOUNTER — Telehealth: Payer: Self-pay | Admitting: *Deleted

## 2021-02-15 NOTE — Telephone Encounter (Signed)
Nichole Chandler is calling back from [facility] and stated that they still haven't received paperwork back for surgical clearance and is asking for a call back, please advise. Surgery is scheduled for 3/28 CB is [phone]

## 2021-02-15 NOTE — Telephone Encounter (Signed)
Patient informed of the message below.  Med list updated, office note from PCP attached and faxed to [facility] at [phone].  Form sent to be scanned.  Patient also informed in the future there is a 5-business day turn around time for forms and PCP is not in the office daily.

## 2021-02-15 NOTE — Telephone Encounter (Signed)
Spoke with Nichole Chandler and informed her PCP was out of the office yesterday, forms were received, Dr Ethlyn Gallery is seeing patients today and forms will be reviewed.  Message sent to PCP.

## 2021-02-15 NOTE — Telephone Encounter (Signed)
Please confirm list of medications and write all current meds (or attach med sheet) to paperwork. Attach last office visit note. I have signed paperwork so it can be returned to them once this is done.

## 2021-02-15 NOTE — Telephone Encounter (Signed)
I just got paperwork today and completed it today.

## 2021-02-18 DIAGNOSIS — Z471 Aftercare following joint replacement surgery: Secondary | ICD-10-CM | POA: Diagnosis not present

## 2021-02-18 DIAGNOSIS — Z96641 Presence of right artificial hip joint: Secondary | ICD-10-CM | POA: Diagnosis not present

## 2021-02-18 DIAGNOSIS — M1611 Unilateral primary osteoarthritis, right hip: Secondary | ICD-10-CM | POA: Diagnosis not present

## 2021-02-18 NOTE — Telephone Encounter (Signed)
See prior phone note. 

## 2021-02-20 DIAGNOSIS — M1611 Unilateral primary osteoarthritis, right hip: Secondary | ICD-10-CM | POA: Diagnosis not present

## 2021-02-21 DIAGNOSIS — Z96641 Presence of right artificial hip joint: Secondary | ICD-10-CM | POA: Diagnosis not present

## 2021-02-21 DIAGNOSIS — Z7982 Long term (current) use of aspirin: Secondary | ICD-10-CM | POA: Diagnosis not present

## 2021-02-21 DIAGNOSIS — M1612 Unilateral primary osteoarthritis, left hip: Secondary | ICD-10-CM | POA: Diagnosis not present

## 2021-02-21 DIAGNOSIS — Z471 Aftercare following joint replacement surgery: Secondary | ICD-10-CM | POA: Diagnosis not present

## 2021-02-21 DIAGNOSIS — M810 Age-related osteoporosis without current pathological fracture: Secondary | ICD-10-CM | POA: Diagnosis not present

## 2021-03-22 DIAGNOSIS — Z96641 Presence of right artificial hip joint: Secondary | ICD-10-CM | POA: Diagnosis not present

## 2021-03-22 DIAGNOSIS — Z471 Aftercare following joint replacement surgery: Secondary | ICD-10-CM | POA: Diagnosis not present

## 2021-04-01 ENCOUNTER — Encounter: Payer: Self-pay | Admitting: Family Medicine

## 2021-04-01 DIAGNOSIS — M1612 Unilateral primary osteoarthritis, left hip: Secondary | ICD-10-CM | POA: Diagnosis not present

## 2021-04-05 DIAGNOSIS — M1612 Unilateral primary osteoarthritis, left hip: Secondary | ICD-10-CM | POA: Diagnosis not present

## 2021-04-11 DIAGNOSIS — D485 Neoplasm of uncertain behavior of skin: Secondary | ICD-10-CM | POA: Diagnosis not present

## 2021-04-11 DIAGNOSIS — L570 Actinic keratosis: Secondary | ICD-10-CM | POA: Diagnosis not present

## 2021-04-19 DIAGNOSIS — M1612 Unilateral primary osteoarthritis, left hip: Secondary | ICD-10-CM | POA: Diagnosis not present

## 2021-05-28 ENCOUNTER — Ambulatory Visit (INDEPENDENT_AMBULATORY_CARE_PROVIDER_SITE_OTHER): Payer: Medicare Other

## 2021-05-28 DIAGNOSIS — Z0000 Encounter for general adult medical examination without abnormal findings: Secondary | ICD-10-CM

## 2021-05-28 NOTE — Patient Instructions (Signed)
Nichole Chandler , Thank you for taking time to come for your Medicare Wellness Visit. I appreciate your ongoing commitment to your health goals. Please review the following plan we discussed and let me know if I can assist you in the future.   Screening recommendations/referrals: Colonoscopy: cologuard 09/07/2020 due 2024 Mammogram: 08/31/2020 Bone Density: 10/82021 due 2023 Recommended yearly ophthalmology/optometry visit for glaucoma screening and checkup Recommended yearly dental visit for hygiene and checkup  Vaccinations: Influenza vaccine: due in fall 2022 Pneumococcal vaccine: declined  Tdap vaccine: 03/24/2017 Shingles vaccine: declined   Advanced directives: will provide copies   Conditions/risks identified: none   Next appointment: 08/25/2021  @1100am  Dr. Jaquelyn Bitter    Preventive Care 73 Years and Older, Female Preventive care refers to lifestyle choices and visits with your health care provider that can promote health and wellness. What does preventive care include? A yearly physical exam. This is also called an annual well check. Dental exams once or twice a year. Routine eye exams. Ask your health care provider how often you should have your eyes checked. Personal lifestyle choices, including: Daily care of your teeth and gums. Regular physical activity. Eating a healthy diet. Avoiding tobacco and drug use. Limiting alcohol use. Practicing safe sex. Taking low-dose aspirin every day. Taking vitamin and mineral supplements as recommended by your health care provider. What happens during an annual well check? The services and screenings done by your health care provider during your annual well check will depend on your age, overall health, lifestyle risk factors, and family history of disease. Counseling  Your health care provider may ask you questions about your: Alcohol use. Tobacco use. Drug use. Emotional well-being. Home and relationship well-being. Sexual  activity. Eating habits. History of falls. Memory and ability to understand (cognition). Work and work Statistician. Reproductive health. Screening  You may have the following tests or measurements: Height, weight, and BMI. Blood pressure. Lipid and cholesterol levels. These may be checked every 5 years, or more frequently if you are over 57 years old. Skin check. Lung cancer screening. You may have this screening every year starting at age 17 if you have a 30-pack-year history of smoking and currently smoke or have quit within the past 15 years. Fecal occult blood test (FOBT) of the stool. You may have this test every year starting at age 82. Flexible sigmoidoscopy or colonoscopy. You may have a sigmoidoscopy every 5 years or a colonoscopy every 10 years starting at age 37. Hepatitis C blood test. Hepatitis B blood test. Sexually transmitted disease (STD) testing. Diabetes screening. This is done by checking your blood sugar (glucose) after you have not eaten for a while (fasting). You may have this done every 1-3 years. Bone density scan. This is done to screen for osteoporosis. You may have this done starting at age 2. Mammogram. This may be done every 1-2 years. Talk to your health care provider about how often you should have regular mammograms. Talk with your health care provider about your test results, treatment options, and if necessary, the need for more tests. Vaccines  Your health care provider may recommend certain vaccines, such as: Influenza vaccine. This is recommended every year. Tetanus, diphtheria, and acellular pertussis (Tdap, Td) vaccine. You may need a Td booster every 10 years. Zoster vaccine. You may need this after age 56. Pneumococcal 13-valent conjugate (PCV13) vaccine. One dose is recommended after age 19. Pneumococcal polysaccharide (PPSV23) vaccine. One dose is recommended after age 49. Talk to your health care provider  about which screenings and vaccines  you need and how often you need them. This information is not intended to replace advice given to you by your health care provider. Make sure you discuss any questions you have with your health care provider. Document Released: 12/07/2015 Document Revised: 07/30/2016 Document Reviewed: 09/11/2015 Elsevier Interactive Patient Education  2017 [place] Prevention in the Home Falls can cause injuries. They can happen to people of all ages. There are many things you can do to make your home safe and to help prevent falls. What can I do on the outside of my home? Regularly fix the edges of walkways and driveways and fix any cracks. Remove anything that might make you trip as you walk through a door, such as a raised step or threshold. Trim any bushes or trees on the path to your home. Use bright outdoor lighting. Clear any walking paths of anything that might make someone trip, such as rocks or tools. Regularly check to see if handrails are loose or broken. Make sure that both sides of any steps have handrails. Any raised decks and porches should have guardrails on the edges. Have any leaves, snow, or ice cleared regularly. Use sand or salt on walking paths during winter. Clean up any spills in your garage right away. This includes oil or grease spills. What can I do in the bathroom? Use night lights. Install grab bars by the toilet and in the tub and shower. Do not use towel bars as grab bars. Use non-skid mats or decals in the tub or shower. If you need to sit down in the shower, use a plastic, non-slip stool. Keep the floor dry. Clean up any water that spills on the floor as soon as it happens. Remove soap buildup in the tub or shower regularly. Attach bath mats securely with double-sided non-slip rug tape. Do not have throw rugs and other things on the floor that can make you trip. What can I do in the bedroom? Use night lights. Make sure that you have a light by your bed that  is easy to reach. Do not use any sheets or blankets that are too big for your bed. They should not hang down onto the floor. Have a firm chair that has side arms. You can use this for support while you get dressed. Do not have throw rugs and other things on the floor that can make you trip. What can I do in the kitchen? Clean up any spills right away. Avoid walking on wet floors. Keep items that you use a lot in easy-to-reach places. If you need to reach something above you, use a strong step stool that has a grab bar. Keep electrical cords out of the way. Do not use floor polish or wax that makes floors slippery. If you must use wax, use non-skid floor wax. Do not have throw rugs and other things on the floor that can make you trip. What can I do with my stairs? Do not leave any items on the stairs. Make sure that there are handrails on both sides of the stairs and use them. Fix handrails that are broken or loose. Make sure that handrails are as long as the stairways. Check any carpeting to make sure that it is firmly attached to the stairs. Fix any carpet that is loose or worn. Avoid having throw rugs at the top or bottom of the stairs. If you do have throw rugs, attach them to the floor  with carpet tape. Make sure that you have a light switch at the top of the stairs and the bottom of the stairs. If you do not have them, ask someone to add them for you. What else can I do to help prevent falls? Wear shoes that: Do not have high heels. Have rubber bottoms. Are comfortable and fit you well. Are closed at the toe. Do not wear sandals. If you use a stepladder: Make sure that it is fully opened. Do not climb a closed stepladder. Make sure that both sides of the stepladder are locked into place. Ask someone to hold it for you, if possible. Clearly mark and make sure that you can see: Any grab bars or handrails. First and last steps. Where the edge of each step is. Use tools that help you  move around (mobility aids) if they are needed. These include: Canes. Walkers. Scooters. Crutches. Turn on the lights when you go into a dark area. Replace any light bulbs as soon as they burn out. Set up your furniture so you have a clear path. Avoid moving your furniture around. If any of your floors are uneven, fix them. If there are any pets around you, be aware of where they are. Review your medicines with your doctor. Some medicines can make you feel dizzy. This can increase your chance of falling. Ask your doctor what other things that you can do to help prevent falls. This information is not intended to replace advice given to you by your health care provider. Make sure you discuss any questions you have with your health care provider. Document Released: 09/06/2009 Document Revised: 04/17/2016 Document Reviewed: 12/15/2014 Elsevier Interactive Patient Education  2017 [employer].

## 2021-05-28 NOTE — Progress Notes (Signed)
Subjective:   Nichole Chandler is a 75 y.o. female who presents for Medicare Annual (Subsequent) preventive examination.  I connected with Nichole Chandler today by telephone and verified that I am speaking with the correct person using two identifiers. Location patient: home Location provider: work Persons participating in the virtual visit: patient, provider.   I discussed the limitations, risks, security and privacy concerns of performing an evaluation and management service by telephone and the availability of in person appointments. I also discussed with the patient that there may be a patient responsible charge related to this service. The patient expressed understanding and verbally consented to this telephonic visit.    Interactive audio and video telecommunications were attempted between this provider and patient, however failed, due to patient having technical difficulties OR patient did not have access to video capability.  We continued and completed visit with audio only.    Review of Systems    N/a       Objective:    There were no vitals filed for this visit. There is no height or weight on file to calculate BMI.  Advanced Directives 05/18/2018 04/29/2017  Does Patient Have a Medical Advance Directive? Yes No    Current Medications (verified) Outpatient Encounter Medications as of 05/28/2021  Medication Sig   ACIDOPHILUS LACTOBACILLUS PO Take by mouth.   B Complex Vitamins (B COMPLEX-B12 PO) B Complex-Vitamin B12 tablet   CALCIUM PO Take by mouth daily.   Cholecalciferol (VITAMIN D3 PO) Take by mouth.   Ginkgo Biloba 120 MG CAPS ginkgo biloba 120 mg tablet   LUTEIN PO Take 25 mg by mouth daily.   Multiple Vitamins-Minerals (WOMENS ONE DAILY PO) Take by mouth.   OVER THE COUNTER MEDICATION Nucific probiotic   No facility-administered encounter medications on file as of 05/28/2021.    Allergies (verified) Patient has no known allergies.   History: Past Medical  History:  Diagnosis Date   Hyperlipidemia    Squamous cell carcinoma 9563   (uncertain date of removal - was on leg; removed entirely)   Past Surgical History:  Procedure Laterality Date   CERVICAL CONE BIOPSY     done in 20's.    Family History  Problem Relation Age of Onset   Kidney cancer Sister        RENAL CARCINOMA   Lung cancer Sister 17       smoker   Heart disease Father 6   Obesity Father    High Cholesterol Father    Other Mother 79   Diabetes Maternal Grandmother    Other Paternal Grandmother        poisoning   Ovarian cancer Sister 50   Social History   Socioeconomic History   Marital status: Widowed    Spouse name: Not on file   Number of children: Not on file   Years of education: Not on file   Highest education level: Not on file  Occupational History   Not on file  Tobacco Use   Smoking status: Never   Smokeless tobacco: Never  Substance and Sexual Activity   Alcohol use: Yes    Alcohol/week: 2.0 standard drinks    Types: 2 Glasses of wine per week   Drug use: Not on file   Sexual activity: Not on file  Other Topics Concern   Not on file  Social History Narrative   Not on file   Social Determinants of Health   Financial Resource Strain: Not on file  Food  Insecurity: Not on file  Transportation Needs: Not on file  Physical Activity: Not on file  Stress: Not on file  Social Connections: Not on file    Tobacco Counseling Counseling given: Not Answered   Clinical Intake:                 Diabetic?no         Activities of Daily Living No flowsheet data found.  Patient Care Team: Caren Macadam, MD as PCP - General (Family Medicine)  Indicate any recent Medical Services you may have received from other than [facility] providers in the past year (date may be approximate).     Assessment:   This is a routine wellness examination for Nichole Chandler.  Hearing/Vision screen No results found.  Dietary issues and exercise  activities discussed:     Goals Addressed   None    Depression Screen PHQ 2/9 Scores 05/18/2018 04/29/2017  PHQ - 2 Score 0 0    Fall Risk Fall Risk  06/27/2019 05/18/2018 04/29/2017  Falls in the past year? (No Data) No No  Comment Emmi Telephone Survey: data to providers prior to load - -  Number falls in past yr: (No Data) - -  Comment Emmi Telephone Survey Actual Response =  - -    FALL RISK PREVENTION PERTAINING TO THE HOME:  Any stairs in or around the home? No  If so, are there any without handrails? No  Home free of loose throw rugs in walkways, pet beds, electrical cords, etc? Yes  Adequate lighting in your home to reduce risk of falls? Yes   ASSISTIVE DEVICES UTILIZED TO PREVENT FALLS:  Life alert? No  Use of a cane, walker or w/c? No  Grab bars in the bathroom? No  Shower chair or bench in shower? Yes  Elevated toilet seat or a handicapped toilet? No    Cognitive Function: Normal cognitive status assessed by direct observation by this Nurse Health Advisor. No abnormalities found.   MMSE - Mini Mental State Exam 05/18/2018 04/29/2017  Not completed: (No Data) (No Data)        Immunizations Immunization History  Administered Date(s) Administered   Pneumococcal Conjugate-13 05/18/2018   Td 03/24/2017    TDAP status: Up to date  Flu Vaccine status: Up to date  Pneumococcal vaccine status: Due, Education has been provided regarding the importance of this vaccine. Advised may receive this vaccine at local pharmacy or Health Dept. Aware to provide a copy of the vaccination record if obtained from local pharmacy or Health Dept. Verbalized acceptance and understanding.  Covid-19 vaccine status: Declined, Education has been provided regarding the importance of this vaccine but patient still declined. Advised may receive this vaccine at local pharmacy or Health Dept.or vaccine clinic. Aware to provide a copy of the vaccination record if obtained from local pharmacy or  Health Dept. Verbalized acceptance and understanding.  Qualifies for Shingles Vaccine? Yes   Zostavax completed No   Shingrix Completed?: Yes  Screening Tests Health Maintenance  Topic Date Due   COVID-19 Vaccine (1) Never done   Zoster Vaccines- Shingrix (1 of 2) Never done   PNA vac Low Risk Adult (2 of 2 - PPSV23) 05/19/2019   INFLUENZA VACCINE  08/20/2022 (Originally 06/24/2021)   MAMMOGRAM  08/31/2022   Fecal DNA (Cologuard)  09/08/2023   TETANUS/TDAP  03/25/2027   DEXA SCAN  Completed   Hepatitis C Screening  Completed   HPV VACCINES  Aged [employer]  Maintenance  Health Maintenance Due  Topic Date Due   COVID-19 Vaccine (1) Never done   Zoster Vaccines- Shingrix (1 of 2) Never done   PNA vac Low Risk Adult (2 of 2 - PPSV23) 05/19/2019    Colorectal cancer screening: Type of screening: Cologuard. Completed 09/07/2020. Repeat every 3 years  Mammogram status: Completed 08/31/2020. Repeat every year  Bone Density status: Completed 10/058/2021. Results reflect: Bone density results: OSTEOPOROSIS. Repeat every 2 years.  Lung Cancer Screening: (Low Dose CT Chest recommended if Age 55-80 years, 30 pack-year currently smoking OR have quit w/in 15years.) does not qualify.   Lung Cancer Screening Referral: n/a  Additional Screening:  Hepatitis C Screening: does not qualify; Completed 03/31/2017  Vision Screening: Recommended annual ophthalmology exams for early detection of glaucoma and other disorders of the eye. Is the patient up to date with their annual eye exam?  Yes  Who is the provider or what is the name of the office in which the patient attends annual eye exams? [facility]  If pt is not established with a provider, would they like to be referred to a provider to establish care? No .   Dental Screening: Recommended annual dental exams for proper oral hygiene  Community Resource Referral / Chronic Care Management: CRR required this visit?  No    CCM required this visit?  No      Plan:     I have personally reviewed and noted the following in the patient's chart:   Medical and social history Use of alcohol, tobacco or illicit drugs  Current medications and supplements including opioid prescriptions.  Functional ability and status Nutritional status Physical activity Advanced directives List of other physicians Hospitalizations, surgeries, and ER visits in previous 12 months Vitals Screenings to include cognitive, depression, and falls Referrals and appointments  In addition, I have reviewed and discussed with patient certain preventive protocols, quality metrics, and best practice recommendations. A written personalized care plan for preventive services as well as general preventive health recommendations were provided to patient.     Randel Pigg, LPN   07/29/2840   Nurse Notes: none

## 2021-06-12 DIAGNOSIS — Z96642 Presence of left artificial hip joint: Secondary | ICD-10-CM | POA: Diagnosis not present

## 2021-06-12 DIAGNOSIS — Z471 Aftercare following joint replacement surgery: Secondary | ICD-10-CM | POA: Diagnosis not present

## 2021-07-10 DIAGNOSIS — L570 Actinic keratosis: Secondary | ICD-10-CM | POA: Diagnosis not present

## 2021-07-24 DIAGNOSIS — L570 Actinic keratosis: Secondary | ICD-10-CM | POA: Diagnosis not present

## 2021-08-06 ENCOUNTER — Other Ambulatory Visit: Payer: Self-pay | Admitting: Family Medicine

## 2021-08-06 DIAGNOSIS — Z1231 Encounter for screening mammogram for malignant neoplasm of breast: Secondary | ICD-10-CM

## 2021-08-21 DIAGNOSIS — L570 Actinic keratosis: Secondary | ICD-10-CM | POA: Diagnosis not present

## 2021-08-28 ENCOUNTER — Encounter: Payer: Self-pay | Admitting: Family Medicine

## 2021-08-28 ENCOUNTER — Ambulatory Visit (INDEPENDENT_AMBULATORY_CARE_PROVIDER_SITE_OTHER): Payer: Medicare Other | Admitting: Family Medicine

## 2021-08-28 ENCOUNTER — Other Ambulatory Visit: Payer: Self-pay

## 2021-08-28 VITALS — BP 138/72 | HR 72 | Temp 98.0°F | Ht 60.5 in | Wt 123.2 lb

## 2021-08-28 DIAGNOSIS — Z13220 Encounter for screening for lipoid disorders: Secondary | ICD-10-CM

## 2021-08-28 DIAGNOSIS — M810 Age-related osteoporosis without current pathological fracture: Secondary | ICD-10-CM | POA: Diagnosis not present

## 2021-08-28 DIAGNOSIS — D691 Qualitative platelet defects: Secondary | ICD-10-CM | POA: Diagnosis not present

## 2021-08-28 DIAGNOSIS — Z131 Encounter for screening for diabetes mellitus: Secondary | ICD-10-CM | POA: Diagnosis not present

## 2021-08-28 LAB — CBC WITH DIFFERENTIAL/PLATELET
Basophils Absolute: 0 10*3/uL (ref 0.0–0.1)
Basophils Relative: 0.5 % (ref 0.0–3.0)
Eosinophils Absolute: 0.1 10*3/uL (ref 0.0–0.7)
Eosinophils Relative: 2 % (ref 0.0–5.0)
HCT: 41.3 % (ref 36.0–46.0)
Hemoglobin: 13.3 g/dL (ref 12.0–15.0)
Lymphocytes Relative: 43.3 % (ref 12.0–46.0)
Lymphs Abs: 2.5 10*3/uL (ref 0.7–4.0)
MCHC: 32.1 g/dL (ref 30.0–36.0)
MCV: 90.6 fl (ref 78.0–100.0)
Monocytes Absolute: 0.8 10*3/uL (ref 0.1–1.0)
Monocytes Relative: 13.1 % — ABNORMAL HIGH (ref 3.0–12.0)
Neutro Abs: 2.4 10*3/uL (ref 1.4–7.7)
Neutrophils Relative %: 41.1 % — ABNORMAL LOW (ref 43.0–77.0)
Platelets: 390 10*3/uL (ref 150.0–400.0)
RBC: 4.56 Mil/uL (ref 3.87–5.11)
RDW: 16.6 % — ABNORMAL HIGH (ref 11.5–15.5)
WBC: 5.8 10*3/uL (ref 4.0–10.5)

## 2021-08-28 LAB — LIPID PANEL
Cholesterol: 218 mg/dL — ABNORMAL HIGH (ref 0–200)
HDL: 84.9 mg/dL (ref >39.00)
LDL Cholesterol: 120 mg/dL — ABNORMAL HIGH (ref 0–99)
NonHDL: 132.88
Total CHOL/HDL Ratio: 3
Triglycerides: 63 mg/dL (ref 0.0–149.0)
VLDL: 12.6 mg/dL (ref 0.0–40.0)

## 2021-08-28 LAB — COMPREHENSIVE METABOLIC PANEL
ALT: 14 U/L (ref 0–35)
AST: 23 U/L (ref 0–37)
Albumin: 4.3 g/dL (ref 3.5–5.2)
Alkaline Phosphatase: 69 U/L (ref 39–117)
BUN: 13 mg/dL (ref 6–23)
CO2: 28 mEq/L (ref 19–32)
Calcium: 9.7 mg/dL (ref 8.4–10.5)
Chloride: 102 mEq/L (ref 96–112)
Creatinine, Ser: 0.62 mg/dL (ref 0.40–1.20)
GFR: 88.49 mL/min (ref >60.00)
Glucose, Bld: 88 mg/dL (ref 70–99)
Potassium: 4.4 mEq/L (ref 3.5–5.1)
Sodium: 137 mEq/L (ref 135–145)
Total Bilirubin: 0.4 mg/dL (ref 0.2–1.2)
Total Protein: 7.2 g/dL (ref 6.0–8.3)

## 2021-08-28 LAB — VITAMIN D 25 HYDROXY (VIT D DEFICIENCY, FRACTURES): VITD: 79.5 ng/mL (ref 30.00–100.00)

## 2021-08-28 NOTE — Progress Notes (Signed)
Nichole Chandler DOB: Apr 15, 1946 Encounter date: 08/28/2021  This is a 75 y.o. female who presents with Chief Complaint  Patient presents with   Annual Exam    History of present illness: Last visit with me was 08/27/2020.  She did have osteoporosis noted on bone density.  She did have follow-up with endocrinology to discuss treatment options.  We discussed waiting until she completed some ongoing dental work before starting any medication. She didn't feel that visit was positive experience for her. Didn't understand why she was recommended to stop eye vitamin or clear on vitamin D dosing.   Has had both hips replaced since I have seen her last - this went great for her. Left hip was harder to recover from. Still dancing, squatting, walking.   She hasn't had dental implant done yet. They wanted to wait until after hips were done. She isn't sure when she is going to complete this.   No Known Allergies Current Meds  Medication Sig   B Complex Vitamins (B COMPLEX-B12 PO)    Cholecalciferol (VITAMIN D3 PO) Take by mouth.   Ginkgo Biloba 120 MG CAPS ginkgo biloba 120 mg tablet   LUTEIN PO Take 25 mg by mouth daily.   Multiple Vitamins-Minerals (WOMENS ONE DAILY PO) Take by mouth.   OVER THE COUNTER MEDICATION K27   [DISCONTINUED] OVER THE COUNTER MEDICATION Nucific probiotic    Review of Systems  Constitutional:  Negative for activity change, appetite change, chills, fatigue, fever and unexpected weight change.  HENT:  Negative for congestion, ear pain, hearing loss, sinus pressure, sinus pain, sore throat and trouble swallowing.   Eyes:  Negative for pain and visual disturbance.  Respiratory:  Negative for cough, chest tightness, shortness of breath and wheezing.   Cardiovascular:  Negative for chest pain, palpitations and leg swelling.  Gastrointestinal:  Negative for abdominal pain, blood in stool, constipation, diarrhea, nausea and vomiting.  Genitourinary:  Negative for  difficulty urinating and menstrual problem.  Musculoskeletal:  Negative for arthralgias and back pain.  Skin:  Negative for rash.  Neurological:  Negative for dizziness, weakness, numbness and headaches.  Hematological:  Negative for adenopathy. Does not bruise/bleed easily.  Psychiatric/Behavioral:  Negative for sleep disturbance and suicidal ideas. The patient is not nervous/anxious.    Objective:  BP 138/72 (BP Location: Left Arm, Patient Position: Sitting, Cuff Size: Normal)   Pulse 72   Temp 98 F (36.7 C) (Oral)   Ht 5' 0.5" (1.537 m)   Wt 123 lb 3.2 oz (55.9 kg)   SpO2 98%   BMI 23.66 kg/m   Weight: 123 lb 3.2 oz (55.9 kg)   BP Readings from Last 3 Encounters:  08/28/21 138/72  10/16/20 130/80  08/27/20 100/74   Wt Readings from Last 3 Encounters:  08/28/21 123 lb 3.2 oz (55.9 kg)  10/16/20 128 lb 6 oz (58.2 kg)  08/27/20 128 lb 1.6 oz (58.1 kg)    Physical Exam Constitutional:      General: She is not in acute distress.    Appearance: She is well-developed.  Cardiovascular:     Rate and Rhythm: Normal rate and regular rhythm.     Heart sounds: Normal heart sounds. No murmur heard.   No friction rub.  Pulmonary:     Effort: Pulmonary effort is normal. No respiratory distress.     Breath sounds: Normal breath sounds. No wheezing or rales.  Abdominal:     General: Abdomen is flat. Bowel sounds are normal.  Palpations: Abdomen is soft.     Tenderness: There is no abdominal tenderness.  Musculoskeletal:     Right lower leg: No edema.     Left lower leg: No edema.  Neurological:     Mental Status: She is alert and oriented to person, place, and time.  Psychiatric:        Behavior: Behavior normal.    Assessment/Plan  1. Age-related osteoporosis without current pathological fracture Radial density was noted as abnormal. She would like to maximize weight bearing exercise, calcium, vitamin D. We will plan to recheck dexa in 2 years (08/2022). Recheck vitamin  D levels today. - VITAMIN D 25 Hydroxy (Vit-D Deficiency, Fractures); Future - VITAMIN D 25 Hydroxy (Vit-D Deficiency, Fractures)  2. Lipid screening She has changed eating to have lower cholesterol diet and work on healthy foods. Recheck lipid today.  - Lipid panel; Future - Lipid panel  3. Screening for diabetes mellitus - Comprehensive metabolic panel; Future - Comprehensive metabolic panel  4. Abnormal platelets (HCC) Platelets just mildly high last year; recheck today. - CBC with Differential/Platelet; Future - CBC with Differential/Platelet     She will get mammogram next week. We will plan to repeat bone density in 2-3 years.      Micheline Rough, MD

## 2021-09-10 ENCOUNTER — Ambulatory Visit
Admission: RE | Admit: 2021-09-10 | Discharge: 2021-09-10 | Disposition: A | Discharge status: Home / Self Care | Payer: Medicare Other | Source: Ambulatory Visit | Attending: Family Medicine | Admitting: Family Medicine

## 2021-09-10 ENCOUNTER — Other Ambulatory Visit: Payer: Self-pay

## 2021-09-10 DIAGNOSIS — Z1231 Encounter for screening mammogram for malignant neoplasm of breast: Secondary | ICD-10-CM | POA: Diagnosis not present

## 2021-09-16 NOTE — Progress Notes (Signed)
All labs normal. Please follow up as directed.

## 2021-09-26 DIAGNOSIS — Z09 Encounter for follow-up examination after completed treatment for conditions other than malignant neoplasm: Secondary | ICD-10-CM | POA: Diagnosis not present

## 2021-09-26 DIAGNOSIS — Z96643 Presence of artificial hip joint, bilateral: Secondary | ICD-10-CM | POA: Diagnosis not present

## 2022-01-07 IMAGING — MG MM DIGITAL SCREENING BILAT W/ TOMO AND CAD
8 series · 9 of 24 positions shown · non-contrast
Comparison: Previous exam(s).

CLINICAL DATA: Screening.

EXAM:
DIGITAL SCREENING BILATERAL MAMMOGRAM WITH TOMOSYNTHESIS AND CAD
TECHNIQUE: Bilateral screening digital craniocaudal and mediolateral oblique
mammograms were obtained. Bilateral screening digital breast
tomosynthesis was performed. The images were evaluated with
computer-aided detection.

[R CC synth-2D]
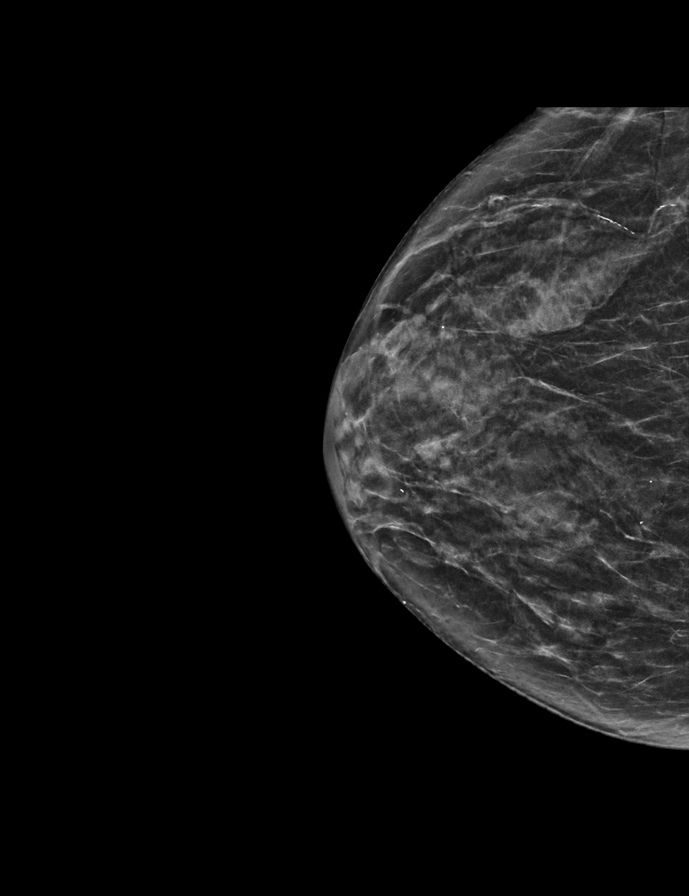

[R MLO synth-2D]
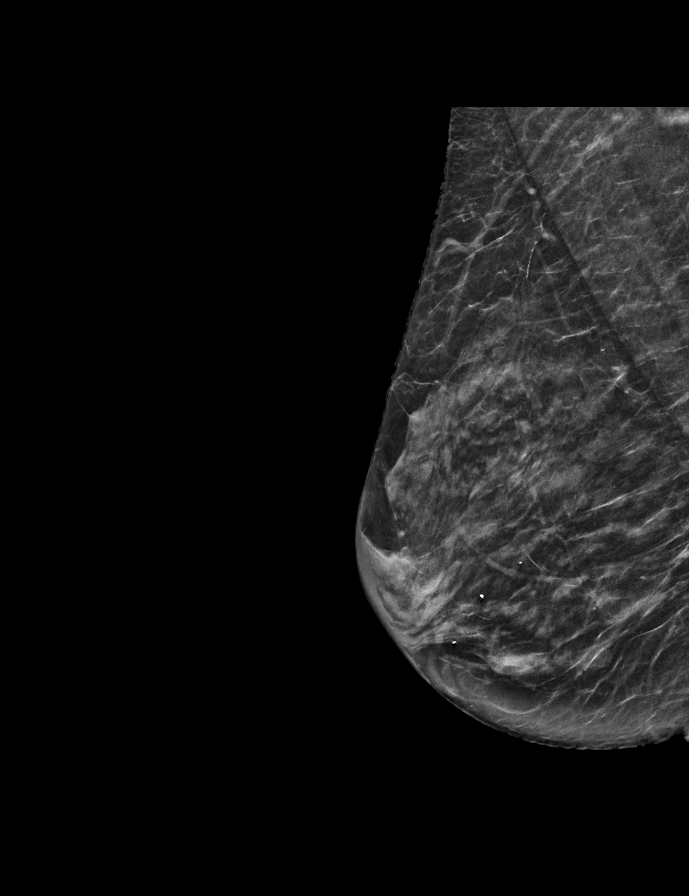

[L MLO synth-2D]
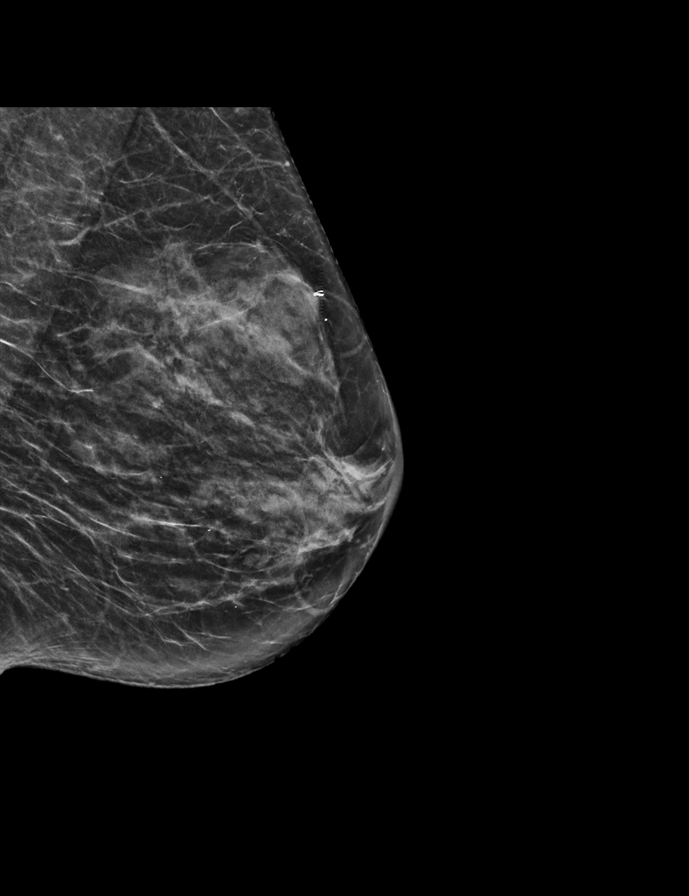

[L CC synth-2D]
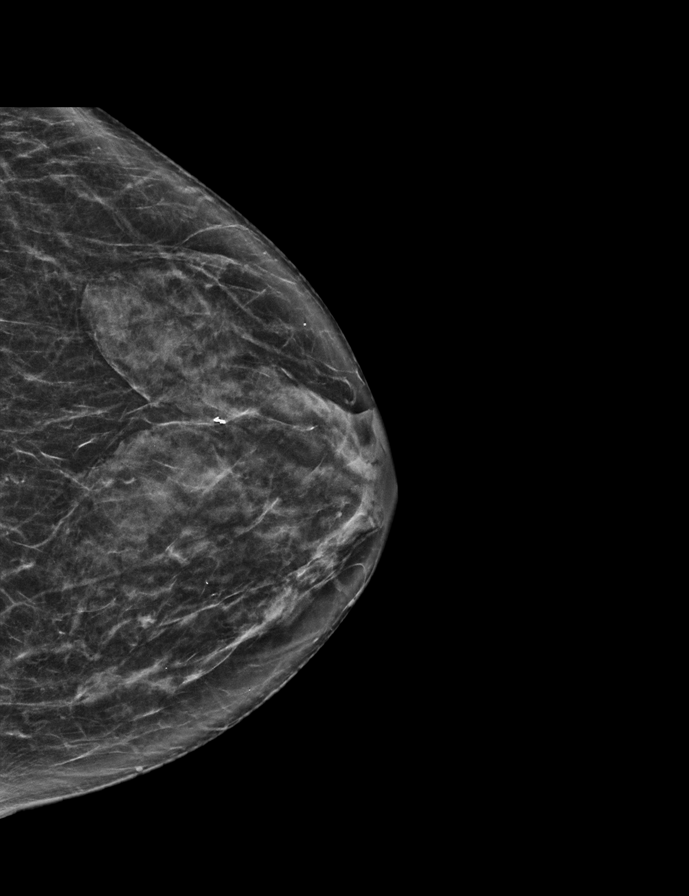

[L MLO tomo · 2 of 53 frames shown]
[frame 18/53]
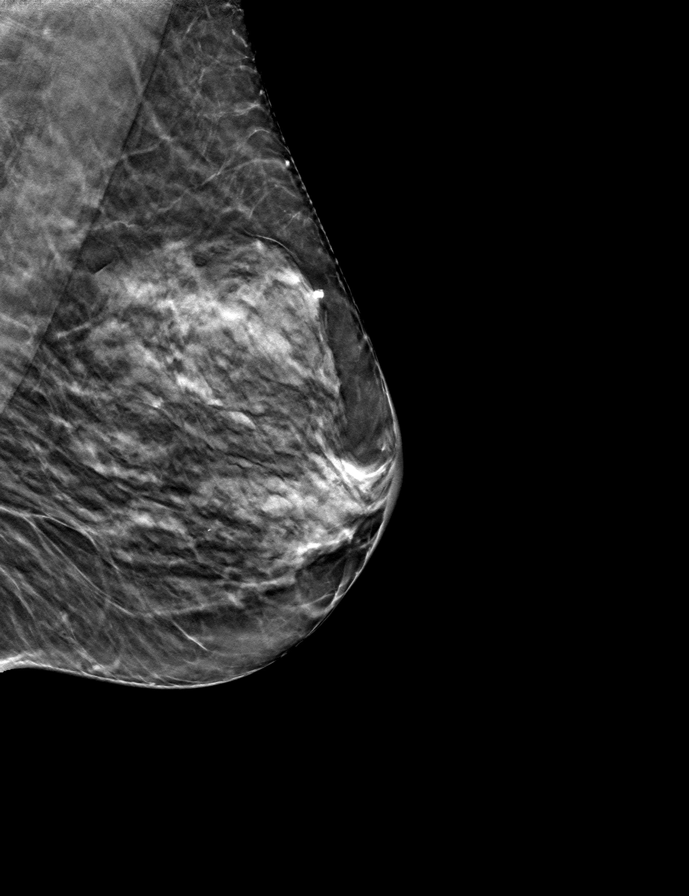
[frame 27/53]
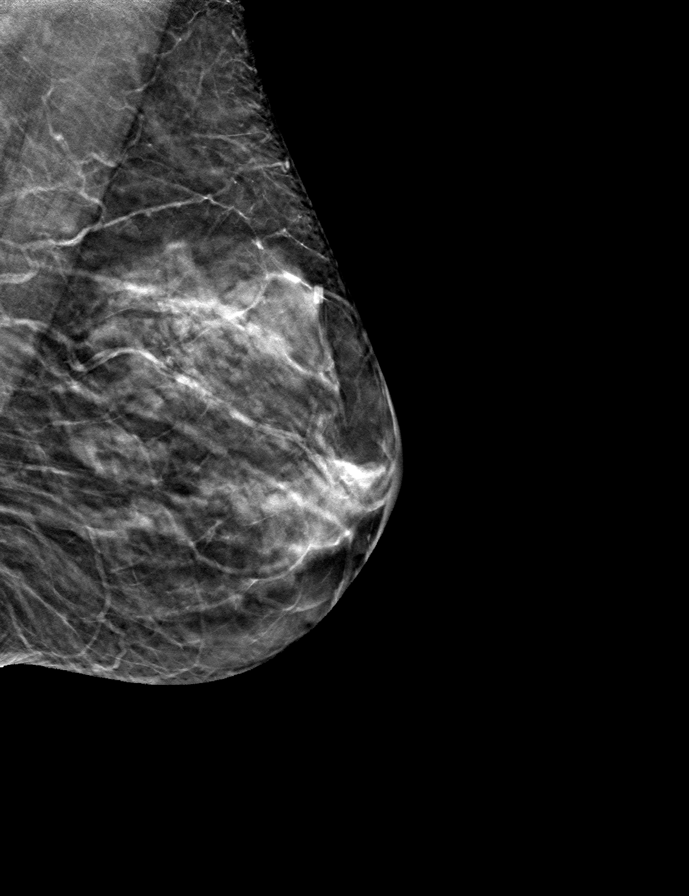

[R CC tomo · tomo slice 27/52.0]
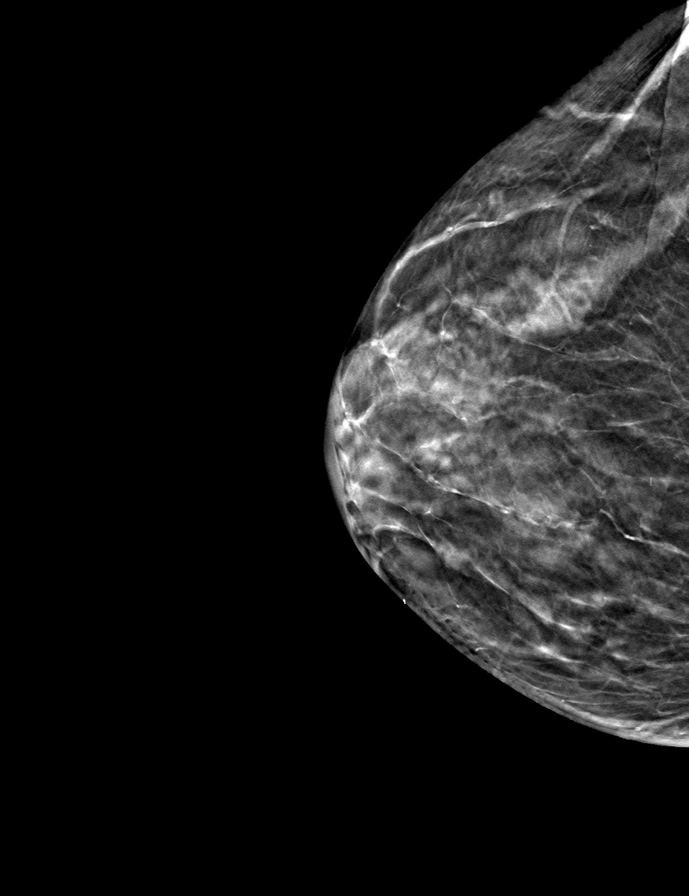

[L CC tomo · tomo slice 27/54.0]
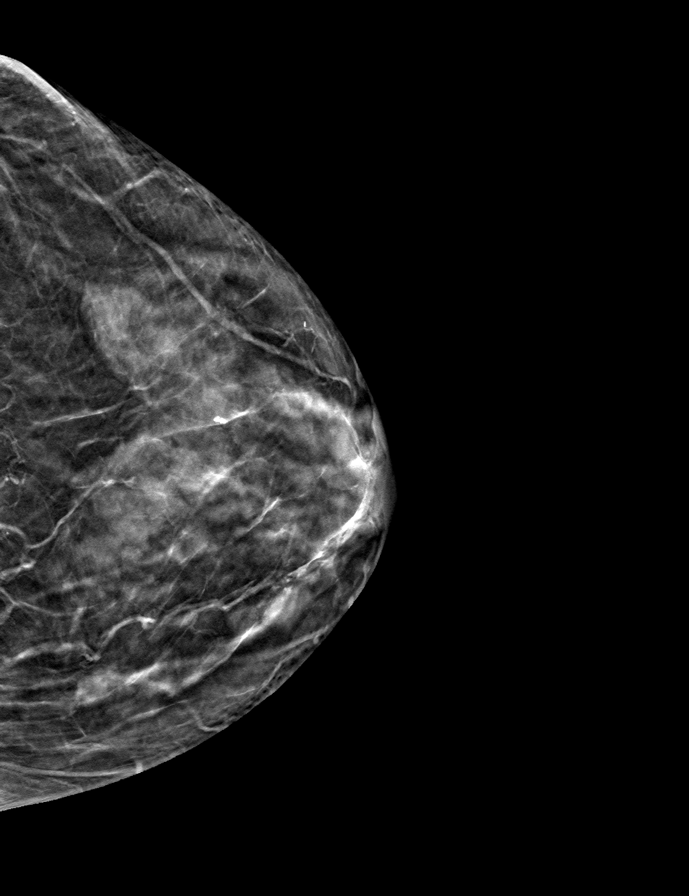

[R MLO tomo · tomo slice 27/52.0]
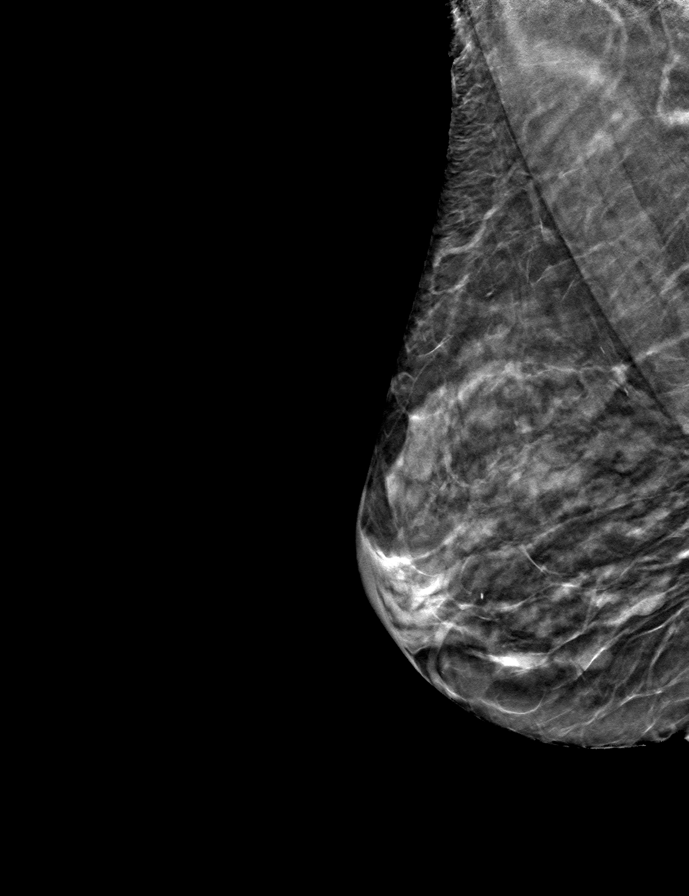

[9 of 24 positions shown; findings below may reference images not displayed]

ACR Breast Density Category c: The breast tissue is heterogeneously
dense, which may obscure small masses.
FINDINGS: There are no findings suspicious for malignancy.
IMPRESSION: No mammographic evidence of malignancy. A result letter of this
screening mammogram will be mailed directly to the patient.

RECOMMENDATION:
Screening mammogram in one year. (Code:Q3-W-BC3)

BI-RADS CATEGORY  1: Negative.

## 2022-04-28 DIAGNOSIS — D485 Neoplasm of uncertain behavior of skin: Secondary | ICD-10-CM | POA: Diagnosis not present

## 2022-04-28 DIAGNOSIS — L570 Actinic keratosis: Secondary | ICD-10-CM | POA: Diagnosis not present

## 2022-04-28 DIAGNOSIS — C44321 Squamous cell carcinoma of skin of nose: Secondary | ICD-10-CM | POA: Diagnosis not present

## 2022-05-15 DIAGNOSIS — C44321 Squamous cell carcinoma of skin of nose: Secondary | ICD-10-CM | POA: Diagnosis not present

## 2022-06-06 ENCOUNTER — Ambulatory Visit (INDEPENDENT_AMBULATORY_CARE_PROVIDER_SITE_OTHER): Payer: Medicare Other

## 2022-06-06 VITALS — Ht 60.5 in | Wt 123.0 lb

## 2022-06-06 DIAGNOSIS — H5213 Myopia, bilateral: Secondary | ICD-10-CM | POA: Diagnosis not present

## 2022-06-06 DIAGNOSIS — H2513 Age-related nuclear cataract, bilateral: Secondary | ICD-10-CM | POA: Diagnosis not present

## 2022-06-06 DIAGNOSIS — Z0000 Encounter for general adult medical examination without abnormal findings: Secondary | ICD-10-CM

## 2022-06-06 DIAGNOSIS — H40023 Open angle with borderline findings, high risk, bilateral: Secondary | ICD-10-CM | POA: Diagnosis not present

## 2022-06-06 NOTE — Progress Notes (Signed)
Subjective:   Nichole Chandler is a 76 y.o. female who presents for Medicare Annual (Subsequent) preventive examination.  Review of Systems    Virtual Visit via Telephone Note  I connected with  Nichole Chandler on 06/06/22 at  1:00 PM EDT by telephone and verified that I am speaking with the correct person using two identifiers.  Location: Patient: Home Provider: Office Persons participating in the virtual visit: patient/Nurse Health Advisor   I discussed the limitations, risks, security and privacy concerns of performing an evaluation and management service by telephone and the availability of in person appointments. The patient expressed understanding and agreed to proceed.  Interactive audio and video telecommunications were attempted between this nurse and patient, however failed, due to patient having technical difficulties OR patient did not have access to video capability.  We continued and completed visit with audio only.  Some vital signs may be absent or patient reported.   Nichole Peaches, LPN  Cardiac Risk Factors include: advanced age (>97mn, >>54women)     Objective:    Today's Vitals   06/06/22 1308  Weight: 123 lb (55.8 kg)  Height: 5' 0.5" (1.537 m)   Body mass index is 23.63 kg/m.     06/06/2022    1:19 PM 05/28/2021   10:39 AM 05/18/2018    8:46 AM 04/29/2017    8:48 PM  Advanced Directives  Does Patient Have a Medical Advance Directive? Yes Yes Yes No  Type of [place]amed[place]of [place] will [place] will    Does patient want to make changes to medical advance directive? No - Patient declined     Copy of [place]? No - copy requested No - copy requested      Current Medications (verified) Outpatient Encounter Medications as of 06/06/2022  Medication Sig   B Complex Vitamins (B COMPLEX-B12 PO)    Cholecalciferol (VITAMIN D3 PO) Take by mouth.   Ginkgo Biloba 120  MG CAPS ginkgo biloba 120 mg tablet   LUTEIN PO Take 25 mg by mouth daily.   Multiple Vitamins-Minerals (WOMENS ONE DAILY PO) Take by mouth.   OVER THE COUNTER MEDICATION K27   No facility-administered encounter medications on file as of 06/06/2022.    Allergies (verified) Patient has no known allergies.   History: Past Medical History:  Diagnosis Date   Hyperlipidemia    Squamous cell carcinoma 19379  (uncertain date of removal - was on leg; removed entirely)   Past Surgical History:  Procedure Laterality Date   CERVICAL CONE BIOPSY     done in 20's.    JOINT REPLACEMENT     Family History  Problem Relation Age of Onset   Kidney cancer Sister        RENAL CARCINOMA   Lung cancer Sister [phone]      smoker   Heart disease Father [phone]  Obesity Father    High Cholesterol Father    Other Mother [phone]  Diabetes Maternal Grandmother    Other Paternal Grandmother        poisoning   Ovarian cancer Sister [phone]  Social History   Socioeconomic History   Marital status: Widowed    Spouse name: Not on file   Number of children: Not on file   Years of education: Not on file   Highest education level: Not on file  Occupational History   Not on file  Tobacco  Use   Smoking status: Never   Smokeless tobacco: Never  Substance and Sexual Activity   Alcohol use: Yes    Alcohol/week: 2.0 standard drinks of alcohol    Types: 2 Glasses of wine per week   Drug use: Not on file   Sexual activity: Not on file  Other Topics Concern   Not on file  Social History Narrative   Not on file   Social Determinants of Health   Financial Resource Strain: Low Risk  (06/06/2022)   Overall Financial Resource Strain (CARDIA)    Difficulty of Paying Living Expenses: Not hard at all  Food Insecurity: No Food Insecurity (06/06/2022)   Hunger Vital Sign    Worried About Running Out of Food in the Last Year: Never true    Ran Out of Food in the Last Year: Never true  Transportation Needs: No  Transportation Needs (06/06/2022)   PRAPARE - Hydrologist (Medical): No    Lack of Transportation (Non-Medical): No  Physical Activity: Sufficiently Active (06/06/2022)   Exercise Vital Sign    Days of Exercise per Week: 3 days    Minutes of Exercise per Session: 60 min  Stress: No Stress Concern Present (06/06/2022)   [place]    Feeling of Stress : Not at all  Social Connections: Moderately Integrated (06/06/2022)   Social Connection and Isolation Panel [NHANES]    Frequency of Communication with Friends and Family: More than three times a week    Frequency of Social Gatherings with Friends and Family: More than three times a week    Attends Religious Services: More than 4 times per year    Active Member of [employer] or Organizations: Yes    Attends Archivist Meetings: More than 4 times per year    Marital Status: Widowed    Tobacco Counseling Counseling given: Not Answered   Clinical Intake:  Pre-visit preparation completed: NoDiabetic?  No  Interpreter Needed?: No Activities of Daily Living    06/06/2022    1:17 PM  In your present state of health, do you have any difficulty performing the following activities:  Hearing? 0  Vision? 0  Difficulty concentrating or making decisions? 0  Walking or climbing stairs? 0  Dressing or bathing? 0  Doing errands, shopping? 0  Preparing Food and eating ? N  Using the Toilet? N  In the past six months, have you accidently leaked urine? N  Do you have problems with loss of bowel control? N  Managing your Medications? N  Managing your Finances? N  Housekeeping or managing your Housekeeping? N    Patient Care Team: Caren Macadam, MD (Inactive) as PCP - General (Family Medicine)  Indicate any recent Medical Services you may have received from other than [facility] providers in the past year (date may be approximate).      Assessment:   This is a routine wellness examination for Nichole Chandler.  Hearing/Vision screen Hearing Screening - Comments:: No hearing difficulty Vision Screening - Comments:: No Vision difficulty  Dietary issues and exercise activities discussed: Exercise limited by: None identified   Goals Addressed               This Visit's Progress     Go to the beach (pt-stated)        Continue to dance.       Depression Screen    06/06/2022    1:15 PM 08/28/2021  11:55 AM 05/28/2021   10:41 AM 05/28/2021   10:37 AM 05/18/2018    8:47 AM 04/29/2017    8:52 PM  PHQ 2/9 Scores  PHQ - 2 Score 0 0 0 0 0 0  PHQ- 9 Score  0        Fall Risk    06/06/2022    1:18 PM 08/28/2021   10:55 AM 05/28/2021   10:40 AM 06/27/2019    5:00 PM 05/18/2018    8:47 AM  Fall Risk   Falls in the past year? 0 0 0  No  Comment    Emmi Telephone Survey: data to providers prior to load   Number falls in past yr: 0 0 0    Comment    Emmi Telephone Survey Actual Response =    Injury with Fall? 0  0    Risk for fall due to : No Fall Risks      Follow up   Falls evaluation completed      [place]:  Any stairs in or around the home? Yes  If so, are there any without handrails? Yes  Home free of loose throw rugs in walkways, pet beds, electrical cords, etc? Yes  Adequate lighting in your home to reduce risk of falls? Yes   ASSISTIVE DEVICES UTILIZED TO PREVENT FALLS:  Life alert? No  Use of a cane, walker or w/c? No  Grab bars in the bathroom? Yes  Shower chair or bench in shower? Yes  Elevated toilet seat or a handicapped toilet? No   TIMED UP AND GO:  Was the test performed? No . Audio Visit  Cognitive Function:        06/06/2022    1:20 PM  6CIT Screen  What Year? 0 points  What month? 0 points  What time? 0 points  Count back from 20 0 points  Months in reverse 0 points  Repeat phrase 0 points  Total Score 0 points    Immunizations Immunization History   Administered Date(s) Administered   Pneumococcal Conjugate-13 05/18/2018   Td 03/24/2017    TDAP status: Up to date  Flu Vaccine status: Declined, Education has been provided regarding the importance of this vaccine but patient still declined. Advised may receive this vaccine at local pharmacy or Health Dept. Aware to provide a copy of the vaccination record if obtained from local pharmacy or Health Dept. Verbalized acceptance and understanding.  Pneumococcal vaccine status: Declined,  Education has been provided regarding the importance of this vaccine but patient still declined. Advised may receive this vaccine at local pharmacy or Health Dept. Aware to provide a copy of the vaccination record if obtained from local pharmacy or Health Dept. Verbalized acceptance and understanding.   Covid-19 vaccine status: Declined, Education has been provided regarding the importance of this vaccine but patient still declined. Advised may receive this vaccine at local pharmacy or Health Dept.or vaccine clinic. Aware to provide a copy of the vaccination record if obtained from local pharmacy or Health Dept. Verbalized acceptance and understanding.  Qualifies for Shingles Vaccine? Yes   Zostavax completed No   Shingrix Completed?: No.    Education has been provided regarding the importance of this vaccine. Patient has been advised to call insurance company to determine out of pocket expense if they have not yet received this vaccine. Advised may also receive vaccine at local pharmacy or Health Dept. Verbalized acceptance and understanding.  Screening Tests Health Maintenance  Topic Date Due   COVID-19 Vaccine (1) 06/22/2022 (Originally 12/19/1946)   Zoster Vaccines- Shingrix (1 of 2) 09/06/2022 (Originally 06/18/1998)   Pneumonia Vaccine 70+ Years old (2 - PPSV23 or PCV20) 06/07/2023 (Originally 05/19/2019)   INFLUENZA VACCINE  06/24/2022   MAMMOGRAM  09/10/2022   Fecal DNA (Cologuard)  09/08/2023    TETANUS/TDAP  03/25/2027   DEXA SCAN  Completed   Hepatitis C Screening  Completed   HPV VACCINES  Aged Out    Health Maintenance  There are no preventive care reminders to display for this patient.     Mammogram status: Completed 09/10/21. Repeat every year  Bone Density status: Completed 08/31/20. Results reflect: Bone density results: OSTEOPOROSIS. Repeat every   years.  Lung Cancer Screening: (Low Dose CT Chest recommended if Age 52-80 years, 30 pack-year currently smoking OR have quit w/in 15years.) does not qualify.     Additional Screening:  Hepatitis C Screening: does qualify; Completed 03/24/17  Vision Screening: Recommended annual ophthalmology exams for early detection of glaucoma and other disorders of the eye. Is the patient up to date with their annual eye exam?  Yes  Who is the provider or what is the name of the office in which the patient attends annual eye exams? Dr Delman Cheadle If pt is not established with a provider, would they like to be referred to a provider to establish care? No .   Dental Screening: Recommended annual dental exams for proper oral hygiene  Community Resource Referral / Chronic Care Management:  CRR required this visit?  No   CCM required this visit?  No      Plan:     I have personally reviewed and noted the following in the patient's chart:   Medical and social history Use of alcohol, tobacco or illicit drugs  Current medications and supplements including opioid prescriptions.  Functional ability and status Nutritional status Physical activity Advanced directives List of other physicians Hospitalizations, surgeries, and ER visits in previous 12 months Vitals Screenings to include cognitive, depression, and falls Referrals and appointments  In addition, I have reviewed and discussed with patient certain preventive protocols, quality metrics, and best practice recommendations. A written personalized care plan for preventive  services as well as general preventive health recommendations were provided to patient.     Nichole Peaches, LPN   4/40/1027   Nurse Notes: None

## 2022-06-06 NOTE — Patient Instructions (Signed)
Nichole Chandler , Thank you for taking time to come for your Medicare Wellness Visit. I appreciate your ongoing commitment to your health goals. Please review the following plan we discussed and let me know if I can assist you in the future.   These are the goals we discussed:  Goals       Go to the beach (pt-stated)      Continue to dance.      patient      Maintain health; Watch chol  Cholesterol Cholesterol is a fat. Your body needs a small amount of cholesterol. Cholesterol (plaque) may build up in your blood vessels (arteries). That makes you more likely to have a heart attack or stroke. You cannot feel your cholesterol level. Having a blood test is the only way to find out if your level is high. Keep your test results. Work with your doctor to keep your cholesterol at a good level. What do the results mean? Total cholesterol is how much cholesterol is in your blood. LDL is bad cholesterol. This is the type that can build up. Try to have low LDL. HDL is good cholesterol. It cleans your blood vessels and carries LDL away. Try to have high HDL. Triglycerides are fat that the body can store or burn for energy. What are good levels of cholesterol? Total cholesterol below 200. LDL below 100 is good for people who have health risks. LDL below 70 is good for people who have very high risks. HDL above 40 is good. It is best to have HDL of 60 or higher. Triglycerides below 150. How can I lower my cholesterol? Diet Follow your diet program as told by your doctor. Choose fish, white meat chicken, or [place] that is roasted or baked. Try not to eat red meat, fried foods, sausage, or lunch meats. Eat lots of fresh fruits and vegetables. Choose whole grains, beans, pasta, potatoes, and cereals. Choose olive oil, corn oil, or canola oil. Only use small amounts. Try not to eat butter, mayonnaise, shortening, or palm kernel oils. Try not to eat foods with trans fats. Choose low-fat or nonfat dairy  foods. Drink skim or nonfat milk. Eat low-fat or nonfat yogurt and cheeses. Try not to drink whole milk or cream. Try not to eat ice cream, egg yolks, or full-fat cheeses. Healthy desserts include angel food cake, ginger snaps, animal crackers, hard candy, popsicles, and low-fat or nonfat frozen yogurt. Try not to eat pastries, cakes, pies, and cookies.  Exercise Follow your exercise program as told by your doctor. Be more active. Try gardening, walking, and taking the stairs. Ask your doctor about ways that you can be more active.  Medicine Take over-the-counter and prescription medicines only as told by your doctor.  This information is not intended to replace advice given to you by your health care provider. Make sure you discuss any questions you have with your health care provider. Document Released: 02/06/2009 Document Revised: 06/11/2016 Document Reviewed: 05/22/2016 Elsevier Interactive Patient Education  2017 [employer].       Patient Stated      Long term goal is to move to the beach; still shagging at [employer].          This is a list of the screening recommended for you and due dates:  Health Maintenance  Topic Date Due   COVID-19 Vaccine (1) Never done   Zoster (Shingles) Vaccine (1 of 2) Never done   Pneumonia Vaccine (2 - PPSV23 or PCV20)  05/19/2019   Flu Shot  06/24/2022   Mammogram  09/10/2022   Cologuard (Stool DNA test)  09/08/2023   Tetanus Vaccine  03/25/2027   DEXA scan (bone density measurement)  Completed   Hepatitis C Screening: USPSTF Recommendation to screen - Ages 47-79 yo.  Completed   HPV Vaccine  Aged Out    Advanced directives: Yes  Conditions/risks identified: None  Next appointment: Follow up in one year for your annual wellness visit     Preventive Care 65 Years and Older, Female Preventive care refers to lifestyle choices and visits with your health care provider that can promote health and wellness. What does preventive care  include? A yearly physical exam. This is also called an annual well check. Dental exams once or twice a year. Routine eye exams. Ask your health care provider how often you should have your eyes checked. Personal lifestyle choices, including: Daily care of your teeth and gums. Regular physical activity. Eating a healthy diet. Avoiding tobacco and drug use. Limiting alcohol use. Practicing safe sex. Taking low-dose aspirin every day. Taking vitamin and mineral supplements as recommended by your health care provider. What happens during an annual well check? The services and screenings done by your health care provider during your annual well check will depend on your age, overall health, lifestyle risk factors, and family history of disease. Counseling  Your health care provider may ask you questions about your: Alcohol use. Tobacco use. Drug use. Emotional well-being. Home and relationship well-being. Sexual activity. Eating habits. History of falls. Memory and ability to understand (cognition). Work and work Statistician. Reproductive health. Screening  You may have the following tests or measurements: Height, weight, and BMI. Blood pressure. Lipid and cholesterol levels. These may be checked every 5 years, or more frequently if you are over 35 years old. Skin check. Lung cancer screening. You may have this screening every year starting at age 20 if you have a 30-pack-year history of smoking and currently smoke or have quit within the past 15 years. Fecal occult blood test (FOBT) of the stool. You may have this test every year starting at age 11. Flexible sigmoidoscopy or colonoscopy. You may have a sigmoidoscopy every 5 years or a colonoscopy every 10 years starting at age 22. Hepatitis C blood test. Hepatitis B blood test. Sexually transmitted disease (STD) testing. Diabetes screening. This is done by checking your blood sugar (glucose) after you have not eaten for a while  (fasting). You may have this done every 1-3 years. Bone density scan. This is done to screen for osteoporosis. You may have this done starting at age 71. Mammogram. This may be done every 1-2 years. Talk to your health care provider about how often you should have regular mammograms. Talk with your health care provider about your test results, treatment options, and if necessary, the need for more tests. Vaccines  Your health care provider may recommend certain vaccines, such as: Influenza vaccine. This is recommended every year. Tetanus, diphtheria, and acellular pertussis (Tdap, Td) vaccine. You may need a Td booster every 10 years. Zoster vaccine. You may need this after age 27. Pneumococcal 13-valent conjugate (PCV13) vaccine. One dose is recommended after age 61. Pneumococcal polysaccharide (PPSV23) vaccine. One dose is recommended after age 73. Talk to your health care provider about which screenings and vaccines you need and how often you need them. This information is not intended to replace advice given to you by your health care provider. Make sure you discuss  any questions you have with your health care provider. Document Released: 12/07/2015 Document Revised: 07/30/2016 Document Reviewed: 09/11/2015 Elsevier Interactive Patient Education  2017 [place] Prevention in the Home Falls can cause injuries. They can happen to people of all ages. There are many things you can do to make your home safe and to help prevent falls. What can I do on the outside of my home? Regularly fix the edges of walkways and driveways and fix any cracks. Remove anything that might make you trip as you walk through a door, such as a raised step or threshold. Trim any bushes or trees on the path to your home. Use bright outdoor lighting. Clear any walking paths of anything that might make someone trip, such as rocks or tools. Regularly check to see if handrails are loose or broken. Make sure that  both sides of any steps have handrails. Any raised decks and porches should have guardrails on the edges. Have any leaves, snow, or ice cleared regularly. Use sand or salt on walking paths during winter. Clean up any spills in your garage right away. This includes oil or grease spills. What can I do in the bathroom? Use night lights. Install grab bars by the toilet and in the tub and shower. Do not use towel bars as grab bars. Use non-skid mats or decals in the tub or shower. If you need to sit down in the shower, use a plastic, non-slip stool. Keep the floor dry. Clean up any water that spills on the floor as soon as it happens. Remove soap buildup in the tub or shower regularly. Attach bath mats securely with double-sided non-slip rug tape. Do not have throw rugs and other things on the floor that can make you trip. What can I do in the bedroom? Use night lights. Make sure that you have a light by your bed that is easy to reach. Do not use any sheets or blankets that are too big for your bed. They should not hang down onto the floor. Have a firm chair that has side arms. You can use this for support while you get dressed. Do not have throw rugs and other things on the floor that can make you trip. What can I do in the kitchen? Clean up any spills right away. Avoid walking on wet floors. Keep items that you use a lot in easy-to-reach places. If you need to reach something above you, use a strong step stool that has a grab bar. Keep electrical cords out of the way. Do not use floor polish or wax that makes floors slippery. If you must use wax, use non-skid floor wax. Do not have throw rugs and other things on the floor that can make you trip. What can I do with my stairs? Do not leave any items on the stairs. Make sure that there are handrails on both sides of the stairs and use them. Fix handrails that are broken or loose. Make sure that handrails are as long as the stairways. Check  any carpeting to make sure that it is firmly attached to the stairs. Fix any carpet that is loose or worn. Avoid having throw rugs at the top or bottom of the stairs. If you do have throw rugs, attach them to the floor with carpet tape. Make sure that you have a light switch at the top of the stairs and the bottom of the stairs. If you do not have them, ask someone to add  them for you. What else can I do to help prevent falls? Wear shoes that: Do not have high heels. Have rubber bottoms. Are comfortable and fit you well. Are closed at the toe. Do not wear sandals. If you use a stepladder: Make sure that it is fully opened. Do not climb a closed stepladder. Make sure that both sides of the stepladder are locked into place. Ask someone to hold it for you, if possible. Clearly mark and make sure that you can see: Any grab bars or handrails. First and last steps. Where the edge of each step is. Use tools that help you move around (mobility aids) if they are needed. These include: Canes. Walkers. Scooters. Crutches. Turn on the lights when you go into a dark area. Replace any light bulbs as soon as they burn out. Set up your furniture so you have a clear path. Avoid moving your furniture around. If any of your floors are uneven, fix them. If there are any pets around you, be aware of where they are. Review your medicines with your doctor. Some medicines can make you feel dizzy. This can increase your chance of falling. Ask your doctor what other things that you can do to help prevent falls. This information is not intended to replace advice given to you by your health care provider. Make sure you discuss any questions you have with your health care provider. Document Released: 09/06/2009 Document Revised: 04/17/2016 Document Reviewed: 12/15/2014 Elsevier Interactive Patient Education  2017 [employer].

## 2022-07-21 DIAGNOSIS — Z96642 Presence of left artificial hip joint: Secondary | ICD-10-CM | POA: Diagnosis not present

## 2022-07-21 DIAGNOSIS — Z96641 Presence of right artificial hip joint: Secondary | ICD-10-CM | POA: Diagnosis not present

## 2022-09-30 DIAGNOSIS — Z09 Encounter for follow-up examination after completed treatment for conditions other than malignant neoplasm: Secondary | ICD-10-CM | POA: Diagnosis not present

## 2022-09-30 DIAGNOSIS — Z96641 Presence of right artificial hip joint: Secondary | ICD-10-CM | POA: Diagnosis not present

## 2022-10-08 ENCOUNTER — Encounter: Payer: Self-pay | Admitting: *Deleted

## 2022-10-08 ENCOUNTER — Telehealth: Payer: Self-pay | Admitting: *Deleted

## 2022-10-08 NOTE — Patient Outreach (Signed)
  Care Coordination   Initial Visit Note   10/08/2022 Name: Nichole Chandler MRN: [id number] DOB: 18-Sep-1946  Nichole Chandler is a 76 y.o. year old female who sees Koberlein, Steele Berg, MD (Inactive) for primary care. I spoke with  Nichole Chandler by phone today.  What matters to the patients health and wellness today?  No needs    Goals Addressed               This Visit's Progress     COMPLETED: No needs (pt-stated)        Care Coordination Interventions: Provided education to patient and/or caregiver about advanced directives Reviewed medications with patient and discussed adherence with all medications with no needed refills Reviewed scheduled/upcoming provider appointments including pending appointments with sufficient transportation Screening for signs and symptoms of depression related to chronic disease state  Assessed social determinant of health barriers          SDOH assessments and interventions completed:  Yes  SDOH Interventions Today    Flowsheet Row Most Recent Value  SDOH Interventions   Food Insecurity Interventions Intervention Not Indicated  Housing Interventions Intervention Not Indicated  Transportation Interventions Intervention Not Indicated  Utilities Interventions Intervention Not Indicated        Care Coordination Interventions Activated:  Yes  Care Coordination Interventions:  Yes, provided   Follow up plan: No further intervention required.   Encounter Outcome:  Pt. Visit Completed   Raina Mina, RN Care Management Coordinator [place] [facility] [phone]

## 2022-10-08 NOTE — Patient Instructions (Signed)
Visit Information  Thank you for taking time to visit with me today. Please don't hesitate to contact me if I can be of assistance to you.   Following are the goals we discussed today:   Goals Addressed               This Visit's Progress     COMPLETED: No needs (pt-stated)        Care Coordination Interventions: Provided education to patient and/or caregiver about advanced directives Reviewed medications with patient and discussed adherence with all medications with no needed refills Reviewed scheduled/upcoming provider appointments including pending appointments with sufficient transportation Screening for signs and symptoms of depression related to chronic disease state  Assessed social determinant of health barriers          Please call the care guide team at [phone] if you need to cancel or reschedule your appointment.   If you are experiencing a Mental Health or [place] or need someone to talk to, please call the Suicide and Crisis Lifeline: 988  Patient verbalizes understanding of instructions and care plan provided today and agrees to view in [place]. Active MyChart status and patient understanding of how to access instructions and care plan via MyChart confirmed with patient.     No further follow up required: No needs  Raina Mina, RN Care Management Coordinator [place] [facility] [phone]

## 2023-06-11 ENCOUNTER — Telehealth: Payer: Medicare Other | Admitting: Family Medicine

## 2023-06-11 VITALS — Wt 118.0 lb

## 2023-06-11 DIAGNOSIS — Z0000 Encounter for general adult medical examination without abnormal findings: Secondary | ICD-10-CM

## 2023-06-11 NOTE — Progress Notes (Signed)
PATIENT CHECK-IN and HEALTH RISK ASSESSMENT QUESTIONNAIRE:  -completed by phone/video for upcoming Medicare Preventive Visit  Pre-Visit Check-in: 1)Vitals (height, wt, BP, etc) - record in vitals section for visit on day of visit 2)Review and Update Medications, Allergies PMH, Surgeries, Social history in Epic 3)Hospitalizations in the last year with date/reason? none 4)Review and Update Care Team (patient's specialists) in Epic 5) Complete PHQ9 in Epic  6) Complete Fall Screening in Epic 7)Review all Health Maintenance Due and order under PCP if not done.  8)Medicare Wellness Questionnaire: Answer theses question about your habits: Do you drink alcohol? Beer, most is 2 per day when she goes dancing Have you ever smoked? no Do you use an illicit drugs? none Do you exercises? Gardens, dancing, never sits, constantly busy, does weight lifting as well at home Are you sexually active? Yes, one partner, no concerns for STIs Typical breakfast: grilled chicken, egg white on a muffin Typical lunch: goes out for lunch sometimes, tries to eat some fruits and veggies - apples, asparagus, broccoli Typical dinner: tries to eat healthy Typical snacks: no snacks  Beverages: coffee  Answer theses question about you: Can you perform most household chores? yes Do you find it hard to follow a conversation in a noisy room?no Do you often ask people to speak up or repeat themselves? no Do you feel that you have a problem with memory?no - does not feel out of the ordinary Do you balance your checkbook and or bank acounts?yes Do you feel safe at home? yes Last dentist visit? Needs to find new dentist as just moved to [place] - plans to tranfer care to there now that she has moved  Do you need assistance with any of the following: Please note if so - none  Driving?  Feeding yourself?  Getting from bed to chair?  Getting to the toilet?  Bathing or showering?  Dressing yourself?  Managing  money?  Climbing a flight of stairs  Preparing meals?  Do you have Advanced Directives in place (Living Will, Healthcare Power or Attorney)? yes   Last eye Exam and location? [facility] - does appt yearly in August   Do you currently use prescribed or non-prescribed narcotic or opioid pain medications?no  Do you have a history or close family history of breast, ovarian, tubal or peritoneal cancer or a family member with BRCA (breast cancer susceptibility 1 and 2) gene mutations? no     ----------------------------------------------------------------------------------------------------------------------------------------------------------------------------------------------------------------------   MEDICARE ANNUAL PREVENTIVE VISIT WITH PROVIDER: (Welcome to [employer], initial annual wellness or annual wellness exam)  Virtual Visit via Video Note  I connected with Tateanna Bach on 06/11/23 by a video enabled telemedicine application and verified that I am speaking with the correct person using two identifiers.  Location patient: home Location provider:work or home office Persons participating in the virtual visit: patient, provider  Concerns and/or follow up today: none, stable   See HM section in Epic for other details of completed HM.    ROS: negative for report of fevers, unintentional weight loss, vision changes, vision loss, hearing loss or change, chest pain, sob, hemoptysis, melena, hematochezia, hematuria, falls, bleeding or bruising, thoughts of suicide or self harm, memory loss  Patient-completed extensive health risk assessment - reviewed and discussed with the patient: See Health Risk Assessment completed with patient prior to the visit either above or in recent phone note. This was reviewed in detailed with the patient today and appropriate recommendations, orders and referrals were placed as needed  per Summary below and patient instructions.   Review  of Medical History: -PMH, PSH, Family History and current specialty and care providers reviewed and updated and listed below   Patient Care Team: Wynn Banker, MD (Inactive) as PCP - General (Family Medicine)   Past Medical History:  Diagnosis Date   Hyperlipidemia    Squamous cell carcinoma 1995   (uncertain date of removal - was on leg; removed entirely)    Past Surgical History:  Procedure Laterality Date   CERVICAL CONE BIOPSY     done in 20's.    JOINT REPLACEMENT      Social History   Socioeconomic History   Marital status: Widowed    Spouse name: Not on file   Number of children: Not on file   Years of education: Not on file   Highest education level: Not on file  Occupational History   Not on file  Tobacco Use   Smoking status: Never   Smokeless tobacco: Never  Substance and Sexual Activity   Alcohol use: Yes    Alcohol/week: 2.0 standard drinks of alcohol    Types: 2 Glasses of wine per week   Drug use: Not on file   Sexual activity: Not on file  Other Topics Concern   Not on file  Social History Narrative   Not on file   Social Determinants of Health   Financial Resource Strain: Low Risk  (06/06/2022)   Overall Financial Resource Strain (CARDIA)    Difficulty of Paying Living Expenses: Not hard at all  Food Insecurity: No Food Insecurity (10/08/2022)   Hunger Vital Sign    Worried About Running Out of Food in the Last Year: Never true    Ran Out of Food in the Last Year: Never true  Transportation Needs: No Transportation Needs (10/08/2022)   PRAPARE - Administrator, Civil Service (Medical): No    Lack of Transportation (Non-Medical): No  Physical Activity: Sufficiently Active (06/06/2022)   Exercise Vital Sign    Days of Exercise per Week: 3 days    Minutes of Exercise per Session: 60 min  Stress: No Stress Concern Present (06/06/2022)   [employer] of Occupational Health - Occupational Stress Questionnaire    Feeling of  Stress : Not at all  Social Connections: Moderately Integrated (06/06/2022)   Social Connection and Isolation Panel [NHANES]    Frequency of Communication with Friends and Family: More than three times a week    Frequency of Social Gatherings with Friends and Family: More than three times a week    Attends Religious Services: More than 4 times per year    Active Member of [employer] or Organizations: Yes    Attends Banker Meetings: More than 4 times per year    Marital Status: Widowed  Intimate Partner Violence: Not At Risk (06/06/2022)   Humiliation, Afraid, Rape, and Kick questionnaire    Fear of Current or Ex-Partner: No    Emotionally Abused: No    Physically Abused: No    Sexually Abused: No    Family History  Problem Relation Age of Onset   Kidney cancer Sister        RENAL CARCINOMA   Lung cancer Sister 104       smoker   Heart disease Father 80   Obesity Father    High Cholesterol Father    Other Mother 24   Diabetes Maternal Grandmother    Other Paternal Grandmother  poisoning   Ovarian cancer Sister 42    Current Outpatient Medications on File Prior to Visit  Medication Sig Dispense Refill   B Complex Vitamins (B COMPLEX-B12 PO)      Cholecalciferol (VITAMIN D3 PO) Take by mouth.     Ginkgo Biloba 120 MG CAPS ginkgo biloba 120 mg tablet     LUTEIN PO Take 25 mg by mouth daily.     Multiple Vitamins-Minerals (WOMENS ONE DAILY PO) Take by mouth.     OVER THE COUNTER MEDICATION K27     No current facility-administered medications on file prior to visit.    No Known Allergies     Physical Exam Vitals requested from patient and listed below if patient had equipment and was able to obtain at home for this virtual visit: There were no vitals filed for this visit. Estimated body mass index is 22.67 kg/m as calculated from the following:   Height as of 06/06/22: 5' 0.5" (1.537 m).   Weight as of this encounter: 118 lb (53.5 kg). Could not check BP  for visit today due to virtual visit.   EKG (optional): deferred due to virtual visit  GENERAL: alert, oriented, no acute distress detected, full vision exam deferred due to pandemic and/or virtual encounter  HEENT: atraumatic, conjunttiva clear, no obvious abnormalities on inspection of external nose and ears  NECK: normal movements of the head and neck  LUNGS: on inspection no signs of respiratory distress, breathing rate appears normal, no obvious gross SOB, gasping or wheezing  CV: no obvious cyanosis  MS: moves all visible extremities without noticeable abnormality  PSYCH/NEURO: pleasant and cooperative, no obvious depression or anxiety, speech and thought processing grossly intact, Cognitive function grossly intact  Flowsheet Row Office Visit from 08/28/2021 in [facility] [place]  PHQ-9 Total Score 0           06/11/2023   10:45 AM 10/08/2022   11:45 AM 10/08/2022   11:44 AM 06/06/2022    1:15 PM 08/28/2021   11:55 AM  Depression screen PHQ 2/9  Decreased Interest 0 0 0 0 0  Down, Depressed, Hopeless 0 0  0 0  PHQ - 2 Score 0 0 0 0 0  Altered sleeping     0  Tired, decreased energy     0  Change in appetite     0  Feeling bad or failure about yourself      0  Trouble concentrating     0  Moving slowly or fidgety/restless     0  Suicidal thoughts     0  PHQ-9 Score     0       06/27/2019    5:00 PM 05/28/2021   10:40 AM 08/28/2021   10:55 AM 06/06/2022    1:18 PM 06/11/2023   10:44 AM  Fall Risk  Falls in the past year? -- 0 0 0 0  Number of falls in past year - Comments Emmi Telephone Survey Actual Response =       Was there an injury with Fall?  0  0 0  Fall Risk Category Calculator  0  0 0  Fall Risk Category (Retired)  Low  Low   (RETIRED) Patient Fall Risk Level  Low fall risk  Low fall risk   Patient at Risk for Falls Due to    No Fall Risks Other (Comment)  Fall risk Follow up  Falls evaluation completed  SUMMARY AND  PLAN:  Encounter for Medicare annual wellness exam   Discussed applicable health maintenance/preventive health measures and advised and referred or ordered per patient preferences: -reports she will set up mammogram -discussed vaccines due and recommendations for each Health Maintenance  Topic Date Due   Zoster Vaccines- Shingrix (1 of 2) Never done   Pneumonia Vaccine 51+ Years old (2 of 2 - PPSV23 or PCV20) 05/19/2019   MAMMOGRAM  09/10/2022   COVID-19 Vaccine (1 - 2023-24 season) 06/26/2024 (Originally 07/25/2022)   INFLUENZA VACCINE  06/25/2023   Fecal DNA (Cologuard)  09/08/2023   Medicare Annual Wellness (AWV)  06/10/2024   DTaP/Tdap/Td (2 - Tdap) 03/25/2027   DEXA SCAN  Completed   Hepatitis C Screening  Completed   HPV VACCINES  Aged [employer] and counseling on the following was provided based on the above review of health and a plan/checklist for the patient, along with additional information discussed, was provided for the patient in the patient instructions :   -Advised and counseled on a healthy lifestyle - including the importance of a healthy diet, regular physical activity, social connections and stress management. -Reviewed patient's current diet. Advised and counseled on a whole foods based healthy diet. A summary of a healthy diet was provided in the Patient Instructions.  -reviewed patient's current physical activity level and discussed exercise guidelines for adults. Discussed community resources and ideas for safe exercise at home to assist in meeting exercise guideline recommendations in a safe and healthy way.  -Advise yearly dental visits at minimum and regular eye exams -Advised and counseled on alcohol safe limits, risks  Follow up: see patient instructions     Patient Instructions  I really enjoyed getting to talk with you today! I am available on Tuesdays and Thursdays for virtual visits if you have any questions or concerns, or if I can be of  any further assistance.   CHECKLIST FROM ANNUAL WELLNESS VISIT:  -Follow up (please call to schedule if not scheduled after visit):   -yearly for annual wellness visit with primary care office  Here is a list of your preventive care/health maintenance measures and the plan for each if any are due:  PLAN For any measures below that may be due:  -can get vaccines at pharmacy -please schedule your mammogram through your new primary care office  Health Maintenance  Topic Date Due   Zoster Vaccines- Shingrix (1 of 2) Never done   Pneumonia Vaccine 63+ Years old (2 of 2 - PPSV23 or PCV20) 05/19/2019   MAMMOGRAM  09/10/2022   COVID-19 Vaccine (1 - 2023-24 season) 06/26/2024 (Originally 07/25/2022)   INFLUENZA VACCINE  06/25/2023   Fecal DNA (Cologuard)  09/08/2023   Medicare Annual Wellness (AWV)  06/10/2024   DTaP/Tdap/Td (2 - Tdap) 03/25/2027   DEXA SCAN  Completed   Hepatitis C Screening  Completed   HPV VACCINES  Aged Out    -See a dentist at least yearly  -Get your eyes checked and then per your eye specialist's recommendations  -Other issues addressed today:  -limit alcohol to no more than one drink per 24 hour period  -I have included below further information regarding a healthy whole foods based diet, physical activity guidelines for adults, stress management and opportunities for social connections. I hope you find this information useful.   -----------------------------------------------------------------------------------------------------------------------------------------------------------------------------------------------------------------------------------------------------------  NUTRITION: -eat real food: lots of colorful vegetables (half the plate) and fruits -5-7 servings of vegetables and fruits per day (  fresh or steamed is best), exp. 2 servings of vegetables with lunch and dinner and 2 servings of fruit per day. Berries and greens such as kale and collards  are great choices.  -consume on a regular basis: whole grains (make sure first ingredient on label contains the word "whole"), fresh fruits, fish, nuts, seeds, healthy oils (such as olive oil, avocado oil, grape seed oil) -may eat small amounts of dairy and lean meat on occasion, but avoid processed meats such as ham, bacon, lunch meat, etc. -drink water -try to avoid fast food and pre-packaged foods, processed meat -most experts advise limiting sodium to < 2300mg  per day, should limit further is any chronic conditions such as high blood pressure, heart disease, diabetes, etc. The American Heart Association advised that < 1500mg  is is ideal -try to avoid foods that contain any ingredients with names you do not recognize  -try to avoid sugar/sweets (except for the natural sugar that occurs in fresh fruit) -try to avoid sweet drinks -try to avoid white rice, white bread, pasta (unless whole grain), white or yellow potatoes  EXERCISE GUIDELINES FOR ADULTS: -if you wish to increase your physical activity, do so gradually and with the approval of your doctor -STOP and seek medical care immediately if you have any chest pain, chest discomfort or trouble breathing when starting or increasing exercise  -move and stretch your body, legs, feet and arms when sitting for long periods -Physical activity guidelines for optimal health in adults: -least 150 minutes per week of aerobic exercise (can talk, but not sing) once approved by your doctor, 20-30 minutes of sustained activity or two 10 minute episodes of sustained activity every day.  -resistance training at least 2 days per week if approved by your doctor -balance exercises 3+ days per week:   Stand somewhere where you have something sturdy to hold onto if you lose balance.    1) lift up on toes, start with 5x per day and work up to 20x   2) stand and lift on leg straight out to the side so that foot is a few inches of the floor, start with 5x each side  and work up to 20x each side   3) stand on one foot, start with 5 seconds each side and work up to 20 seconds on each side  If you need ideas or help with getting more active:  -Silver sneakers https://tools.silversneakers.com  -Walk with a Doc: http://www.duncan-williams.com/  -try to include resistance (weight lifting/strength building) and balance exercises twice per week: or the following link for ideas: http://castillo-powell.com/  BuyDucts.dk  STRESS MANAGEMENT: -can try meditating, or just sitting quietly with deep breathing while intentionally relaxing all parts of your body for 5 minutes daily -if you need further help with stress, anxiety or depression please follow up with your primary doctor or contact the wonderful folks at [facility]: [phone]              Terressa Koyanagi, DO

## 2023-06-11 NOTE — Patient Instructions (Addendum)
I really enjoyed getting to talk with you today! I am available on Tuesdays and Thursdays for virtual visits if you have any questions or concerns, or if I can be of any further assistance.   CHECKLIST FROM ANNUAL WELLNESS VISIT:  -Follow up (please call to schedule if not scheduled after visit):   -yearly for annual wellness visit with primary care office  Here is a list of your preventive care/health maintenance measures and the plan for each if any are due:  PLAN For any measures below that may be due:  -can get vaccines at pharmacy -please schedule your mammogram through your new primary care office  Health Maintenance  Topic Date Due   Zoster Vaccines- Shingrix (1 of 2) Never done   Pneumonia Vaccine 24+ Years old (2 of 2 - PPSV23 or PCV20) 05/19/2019   MAMMOGRAM  09/10/2022   COVID-19 Vaccine (1 - 2023-24 season) 06/26/2024 (Originally 07/25/2022)   INFLUENZA VACCINE  06/25/2023   Fecal DNA (Cologuard)  09/08/2023   Medicare Annual Wellness (AWV)  06/10/2024   DTaP/Tdap/Td (2 - Tdap) 03/25/2027   DEXA SCAN  Completed   Hepatitis C Screening  Completed   HPV VACCINES  Aged Out    -See a dentist at least yearly  -Get your eyes checked and then per your eye specialist's recommendations  -Other issues addressed today:  -limit alcohol to no more than one drink per 24 hour period  -I have included below further information regarding a healthy whole foods based diet, physical activity guidelines for adults, stress management and opportunities for social connections. I hope you find this information useful.   -----------------------------------------------------------------------------------------------------------------------------------------------------------------------------------------------------------------------------------------------------------  NUTRITION: -eat real food: lots of colorful vegetables (half the plate) and fruits -5-7 servings of vegetables and fruits  per day (fresh or steamed is best), exp. 2 servings of vegetables with lunch and dinner and 2 servings of fruit per day. Berries and greens such as kale and collards are great choices.  -consume on a regular basis: whole grains (make sure first ingredient on label contains the word "whole"), fresh fruits, fish, nuts, seeds, healthy oils (such as olive oil, avocado oil, grape seed oil) -may eat small amounts of dairy and lean meat on occasion, but avoid processed meats such as ham, bacon, lunch meat, etc. -drink water -try to avoid fast food and pre-packaged foods, processed meat -most experts advise limiting sodium to < 2300mg  per day, should limit further is any chronic conditions such as high blood pressure, heart disease, diabetes, etc. The American Heart Association advised that < 1500mg  is is ideal -try to avoid foods that contain any ingredients with names you do not recognize  -try to avoid sugar/sweets (except for the natural sugar that occurs in fresh fruit) -try to avoid sweet drinks -try to avoid white rice, white bread, pasta (unless whole grain), white or yellow potatoes  EXERCISE GUIDELINES FOR ADULTS: -if you wish to increase your physical activity, do so gradually and with the approval of your doctor -STOP and seek medical care immediately if you have any chest pain, chest discomfort or trouble breathing when starting or increasing exercise  -move and stretch your body, legs, feet and arms when sitting for long periods -Physical activity guidelines for optimal health in adults: -least 150 minutes per week of aerobic exercise (can talk, but not sing) once approved by your doctor, 20-30 minutes of sustained activity or two 10 minute episodes of sustained activity every day.  -resistance training at least 2 days per  week if approved by your doctor -balance exercises 3+ days per week:   Stand somewhere where you have something sturdy to hold onto if you lose balance.    1) lift up on  toes, start with 5x per day and work up to 20x   2) stand and lift on leg straight out to the side so that foot is a few inches of the floor, start with 5x each side and work up to 20x each side   3) stand on one foot, start with 5 seconds each side and work up to 20 seconds on each side  If you need ideas or help with getting more active:  -Silver sneakers https://tools.silversneakers.com  -Walk with a Doc: http://www.duncan-williams.com/  -try to include resistance (weight lifting/strength building) and balance exercises twice per week: or the following link for ideas: http://castillo-powell.com/  BuyDucts.dk  STRESS MANAGEMENT: -can try meditating, or just sitting quietly with deep breathing while intentionally relaxing all parts of your body for 5 minutes daily -if you need further help with stress, anxiety or depression please follow up with your primary doctor or contact the wonderful folks at [facility]: [phone]
